# Patient Record
Sex: Female | Born: 1970 | Race: White | Hispanic: No | Marital: Married | Smoking: Never smoker
Health system: Southern US, Community
[De-identification: ages and names within clinical notes are randomized; demographics above are authoritative.]

## PROBLEM LIST (undated history)

## (undated) DIAGNOSIS — N61 Mastitis without abscess: Secondary | ICD-10-CM

## (undated) DIAGNOSIS — L732 Hidradenitis suppurativa: Secondary | ICD-10-CM

## (undated) DIAGNOSIS — F329 Major depressive disorder, single episode, unspecified: Secondary | ICD-10-CM

## (undated) DIAGNOSIS — F32A Depression, unspecified: Secondary | ICD-10-CM

## (undated) DIAGNOSIS — M542 Cervicalgia: Secondary | ICD-10-CM

## (undated) DIAGNOSIS — R200 Anesthesia of skin: Secondary | ICD-10-CM

## (undated) DIAGNOSIS — I82409 Acute embolism and thrombosis of unspecified deep veins of unspecified lower extremity: Secondary | ICD-10-CM

## (undated) HISTORY — PX: INGUINAL HIDRADENITIS EXCISION: SHX1827

## (undated) HISTORY — DX: Cervicalgia: M54.2

## (undated) HISTORY — PX: OTHER SURGICAL HISTORY: SHX169

## (undated) HISTORY — DX: Depression, unspecified: F32.A

## (undated) HISTORY — DX: Major depressive disorder, single episode, unspecified: F32.9

## (undated) HISTORY — DX: Anesthesia of skin: R20.0

## (undated) HISTORY — PX: AXILLARY HIDRADENITIS EXCISION: SUR522

## (undated) HISTORY — DX: Morbid (severe) obesity due to excess calories: E66.01

---

## 1999-07-10 ENCOUNTER — Emergency Department (HOSPITAL_COMMUNITY): Admission: EM | Admit: 1999-07-10 | Discharge: 1999-07-10 | Payer: Self-pay | Admitting: Emergency Medicine

## 1999-07-10 ENCOUNTER — Encounter: Payer: Self-pay | Admitting: Emergency Medicine

## 2000-01-21 ENCOUNTER — Encounter: Admission: RE | Admit: 2000-01-21 | Discharge: 2000-04-20 | Payer: Self-pay | Admitting: Neurology

## 2000-02-04 ENCOUNTER — Encounter: Admission: RE | Admit: 2000-02-04 | Discharge: 2000-05-04 | Payer: Self-pay | Admitting: Neurology

## 2000-03-16 ENCOUNTER — Ambulatory Visit (HOSPITAL_COMMUNITY): Admission: RE | Admit: 2000-03-16 | Discharge: 2000-03-16 | Payer: Self-pay | Admitting: Neurology

## 2000-06-20 ENCOUNTER — Encounter: Admission: RE | Admit: 2000-06-20 | Discharge: 2000-09-18 | Payer: Self-pay | Admitting: Neurology

## 2001-07-18 ENCOUNTER — Emergency Department (HOSPITAL_COMMUNITY): Admission: EM | Admit: 2001-07-18 | Discharge: 2001-07-18 | Payer: Self-pay | Admitting: *Deleted

## 2007-03-15 ENCOUNTER — Observation Stay (HOSPITAL_COMMUNITY): Admission: RE | Admit: 2007-03-15 | Discharge: 2007-03-16 | Payer: Self-pay | Admitting: General Surgery

## 2007-03-15 ENCOUNTER — Encounter (INDEPENDENT_AMBULATORY_CARE_PROVIDER_SITE_OTHER): Payer: Self-pay | Admitting: General Surgery

## 2007-03-18 ENCOUNTER — Emergency Department (HOSPITAL_COMMUNITY): Admission: EM | Admit: 2007-03-18 | Discharge: 2007-03-18 | Payer: Self-pay | Admitting: Emergency Medicine

## 2007-03-23 ENCOUNTER — Encounter (HOSPITAL_COMMUNITY): Admission: RE | Admit: 2007-03-23 | Discharge: 2007-04-22 | Payer: Self-pay | Admitting: General Surgery

## 2008-07-24 ENCOUNTER — Emergency Department (HOSPITAL_COMMUNITY): Admission: EM | Admit: 2008-07-24 | Discharge: 2008-07-24 | Payer: Self-pay | Admitting: Emergency Medicine

## 2009-07-29 ENCOUNTER — Emergency Department (HOSPITAL_COMMUNITY): Admission: EM | Admit: 2009-07-29 | Discharge: 2009-07-29 | Payer: Self-pay | Admitting: Emergency Medicine

## 2010-12-29 NOTE — Op Note (Signed)
NAMEHIEDI, TOUCHTON                ACCOUNT NO.:  [id number]   MEDICAL RECORD NO.:  [id number]          PATIENT TYPE:  OBV   LOCATION:  A306                          FACILITY:  [facility]   PHYSICIAN:  Barbaraann Barthel, M.D. DATE OF BIRTH:  05-15-1971   DATE OF PROCEDURE:  03/15/2007  DATE OF DISCHARGE:                               OPERATIVE REPORT   PREOPERATIVE DIAGNOSIS:  Hidradenitis suppurativa.   POSTOPERATIVE DIAGNOSIS:  Hidradenitis suppurativa.   PROCEDURE:  Excision of hidradenitis suppurativa from right axilla and  right and left groin areas.   NOTE:  This is an unfortunate 40 year old white female who has had  multiple episodes of procedures for her hidradenitis suppurativa.  She  had done her left axilla and she had had both her groin area and her  inframammary area and her right axilla done elsewhere at [facility],  and various other hospitals, and she returned to me as these had  recurred in the area of her groin and her right axilla.  We discussed  the procedure with her preoperatively putting close attention to the  fact that during one of these procedures she developed some neurapraxia  on her left side from being in the frog leg position.  We had this in  mind during the procedure.  We also discussed the need for extensive  dressing changes afterwards with soaks, etc., and perhaps a split  thickness skin graft at a later date.  As this patient has had multiple  procedures, she understood this quite well as did her mother and all  questions were answered to their satisfaction and informed consent was  obtained.   GROSS OPERATIVE FINDINGS:  The patient had obvious recurrent  hidradenitis suppurativa in her right axilla and both groin areas.   TECHNIQUE:  We kept the patient in the supine position without being in  any frog leg position and worked first on the groin. After prepping and  draping, we excised the area of hidradenitis suppurativa from the right  axilla  and then packed this open with saline soaked gauze and Kerlix  after hemostasis was obtained with the Bovie device.  While I held her  left thigh in a slightly frog leg position, we then prepped with  Betadine solution, excised this quickly using the cautery device and the  scalpel, and obtained hemostasis with the cautery device, returned her  to a straight leg position, and this was a very minimal amount of time  in this position without any tension of her body weight on this so that  I do not think we did any harm to her baseline status.  The wound,  likewise, was packed open with saline soaked gauze, ABDs, Kerlix, and  Kling.  An area, likewise, was done in the right groin, excising this  widely and  packing this open, as well.  Prior to closure, all sponge, needle, and  instrument counts were found to be correct.  Estimated blood loss was  minimal.  The patient received approximately a liter of crystalloids  intraoperatively and we will keep her in the hospital for  observation.  I will follow her for her dressing changes.      Barbaraann Barthel, M.D.  Electronically Signed     WB/MEDQ  D:  03/15/2007  T:  03/15/2007  Job:  [id number]   cc:   Mila Homer. Sudie Bailey, M.D.  Fax: [phone]

## 2011-01-01 NOTE — Procedures (Signed)
[facility]  Patient:    Cheryl Bentley                          MRN: [id number] Proc. Date: 03/16/00 Adm. Date:  [id number] Attending:  Erich Montane                           Procedure Report  CLINICAL INFORMATION:  This 40 year old white female has a history of left-sided neuropathy, headaches, and recently discovered papilledema.  She has been on a weight reduction diet for a suspected pseudotumor cerebri.  TECHNICAL DESCRIPTION:  The patient was prepped and draped in the left lateral decubitus position using Betadine and 1% Xylocaine.  She had a lot of fatty adipose tissue between her skin surface and the lumbar spine but the L4-L5 interspace was entered, initially pink to clear CSF was obtained.  The opening pressure was 360 mm of water and CSF was sent for studies.  The patient tolerated the procedure well.  No closing pressure was obtained. DD:  03/16/00 TD:  03/17/00 Job: [id number]

## 2011-05-21 LAB — PREGNANCY, URINE: Preg Test, Ur: NEGATIVE

## 2011-05-31 LAB — BASIC METABOLIC PANEL
BUN: 9
CO2: 28
Calcium: 8.7
Chloride: 105
Creatinine, Ser: 0.64
Glucose, Bld: 104 — ABNORMAL HIGH

## 2011-05-31 LAB — HCG, QUANTITATIVE, PREGNANCY: hCG, Beta Chain, Quant, S: 3

## 2011-05-31 LAB — CBC
MCHC: 33.6
MCV: 87.4
Platelets: 241
RDW: 14.7 — ABNORMAL HIGH

## 2011-05-31 LAB — DIFFERENTIAL
Basophils Absolute: 0
Basophils Relative: 0
Eosinophils Absolute: 0.1
Monocytes Relative: 5
Neutrophils Relative %: 68

## 2011-06-08 ENCOUNTER — Emergency Department (HOSPITAL_COMMUNITY)
Admission: EM | Admit: 2011-06-08 | Discharge: 2011-06-08 | Disposition: A | Discharge status: Home / Self Care | Payer: 59 | Attending: Emergency Medicine | Admitting: Emergency Medicine

## 2011-06-08 ENCOUNTER — Encounter: Payer: Self-pay | Admitting: *Deleted

## 2011-06-08 DIAGNOSIS — S61259A Open bite of unspecified finger without damage to nail, initial encounter: Secondary | ICD-10-CM

## 2011-06-08 DIAGNOSIS — S6000XA Contusion of unspecified finger without damage to nail, initial encounter: Secondary | ICD-10-CM | POA: Insufficient documentation

## 2011-06-08 DIAGNOSIS — IMO0002 Reserved for concepts with insufficient information to code with codable children: Secondary | ICD-10-CM | POA: Insufficient documentation

## 2011-06-08 DIAGNOSIS — W540XXA Bitten by dog, initial encounter: Secondary | ICD-10-CM | POA: Insufficient documentation

## 2011-06-08 HISTORY — DX: Hidradenitis suppurativa: L73.2

## 2011-06-08 MED ORDER — CEPHALEXIN 500 MG PO CAPS: 500.0000 mg | ORAL_CAPSULE | Freq: Four times a day (QID) | ORAL | Status: AC

## 2011-06-08 MED ORDER — BACITRACIN ZINC 500 UNIT/GM EX OINT
TOPICAL_OINTMENT | CUTANEOUS | Status: AC
  Administered 2011-06-08: 1
  Filled 2011-06-08: qty 0.9

## 2011-06-08 NOTE — ED Notes (Signed)
Rx given x1 D/c instructions reviewed w/ pt and family - pt and family deny any further questions or concerns at present.  

## 2011-06-08 NOTE — ED Notes (Signed)
Report made to [facility], deputy enroute

## 2011-06-08 NOTE — ED Notes (Signed)
Pt reports she and her brother were attempting to break up a dog fight - pt sustained injury to rt third digit, small puncture wound to ant aspect and injury to nail bed. Pt states she receives Humira injections and is concerned about the injuries d/t immunocompromised. No bleeding noted on assessment. 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

## 2011-06-08 NOTE — ED Provider Notes (Signed)
History     CSN: [id number] Arrival date & time: 06/08/2011  2:25 AM   First MD Initiated Contact with Patient 06/08/11 0243      Chief Complaint  Patient presents with  . Animal Bite    (Consider location/radiation/quality/duration/timing/severity/associated sxs/prior treatment) HPI Comments: Patient is a healthy 32 atrial female who presents approximately one hour after being bitten on her left middle finger by her dog. This dog is up-to-date on vaccinations and was upset because he was fighting with another dog over food. She states that the injury is constant, mild, worse with palpation and associated with a small crack in the nail with minimal amount of bleeding. He is up-to-date on her tetanus as of several months ago.  Patient is a 40 y.o. female presenting with animal bite. The history is provided by the patient.  Animal Bite  Associated symptoms include numbness.    History reviewed. No pertinent past medical history.  Past Surgical History  Procedure Date  . Axillary hidradenitis excision     No family history on file.  History  Substance Use Topics  . Smoking status: Never Smoker   . Smokeless tobacco: Not on file  . Alcohol Use: No    OB History    Grav Para Term Preterm Abortions TAB SAB Ect Mult Living                  Review of Systems  Skin: Positive for wound.  Neurological: Positive for numbness.  Hematological: Does not bruise/bleed easily.    Allergies  Amoxicillin and Penicillins  Home Medications   Current Outpatient Rx  Name Route Sig Dispense Refill  . HUMIRA [place] Subcutaneous Inject into the skin. Every other week       Pulse 126  Temp(Src) 98.3 F (36.8 C) (Oral)  Ht 6\' 1"  (1.854 m)  Wt 290 lb (131.543 kg)  BMI 38.26 kg/m2  SpO2 98%  LMP 06/01/2011  Physical Exam  Nursing note and vitals reviewed. Constitutional: She appears well-developed and well-nourished. No distress.  HENT:  Head: Normocephalic and atraumatic.    Eyes: Conjunctivae are normal. No scleral icterus.  Musculoskeletal: Normal range of motion. She exhibits tenderness. She exhibits no edema.       While tenderness to the distal left third digit, normal range of motion, normal flexor and extensor function of the finger.  Neurological: She is alert. Coordination normal.       Incision and motor normal to the finger on the left hand  Skin: Skin is warm and dry.       Abrasion to the volar pad of the distal third digit on the left, 1 cm crack in the mid to proximal nail of same finger, small subungual hematoma which is adequately draining.   Psychiatric: She has a normal mood and affect. Her behavior is normal.    ED Course  Procedures (including critical care time)  Labs Reviewed - No data to display No results found.   No diagnosis found.    MDM  Up-to-date on tetanus, patient is currently immunosuppressed on Humira will need antibiotics to prevent infection otherwise well appearing with no repairable injuries.        Vida Roller, MD 06/08/11 [phone]

## 2011-06-08 NOTE — ED Notes (Signed)
Bacitracin and adhesive bandage applied to wound.

## 2011-06-08 NOTE — ED Notes (Signed)
Pt reports she was attempting to separate her 2 labs that were fighting over food, pt c/o pain to left middle finger, pt reports both dogs are up to date with vaccinations

## 2013-08-01 ENCOUNTER — Emergency Department (INDEPENDENT_AMBULATORY_CARE_PROVIDER_SITE_OTHER)
Admission: EM | Admit: 2013-08-01 | Discharge: 2013-08-01 | Disposition: A | Discharge status: Home / Self Care | Payer: Self-pay | Source: Home / Self Care | Attending: Family Medicine | Admitting: Family Medicine

## 2013-08-01 ENCOUNTER — Encounter (HOSPITAL_COMMUNITY): Payer: Self-pay | Admitting: Emergency Medicine

## 2013-08-01 DIAGNOSIS — G5620 Lesion of ulnar nerve, unspecified upper limb: Secondary | ICD-10-CM

## 2013-08-01 DIAGNOSIS — G5622 Lesion of ulnar nerve, left upper limb: Secondary | ICD-10-CM

## 2013-08-01 NOTE — ED Notes (Signed)
Left arm numbness, onset 12/17

## 2013-08-01 NOTE — ED Provider Notes (Signed)
CSN: [id number]     Arrival date & time 08/01/13  1241 History   First MD Initiated Contact with Patient 08/01/13 1427     Chief Complaint  Patient presents with  . Numbness   (Consider location/radiation/quality/duration/timing/severity/associated sxs/prior Treatment) Patient is a 42 y.o. female presenting with neurologic complaint.  Neurologic Problem This is a new problem. The current episode started yesterday (tingling in left 4th and 5th fingers yest at work, pain radiating up arm todat, no neck pain, no apparent trauma to arm, works at [employer].). The problem has been gradually worsening. Pertinent negatives include no headaches.    Past Medical History  Diagnosis Date  . Hidradenitis suppurativa    Past Surgical History  Procedure Laterality Date  . Axillary hidradenitis excision     No family history on file. History  Substance Use Topics  . Smoking status: Never Smoker   . Smokeless tobacco: Not on file  . Alcohol Use: No   OB History   Grav Para Term Preterm Abortions TAB SAB Ect Mult Living                 Review of Systems  Constitutional: Negative.   Neurological: Positive for numbness. Negative for dizziness, tremors, weakness, light-headedness and headaches.    Allergies  Amoxicillin and Penicillins  Home Medications   Current Outpatient Rx  Name  Route  Sig  Dispense  Refill  . spironolactone (ALDACTONE) 50 MG tablet   Oral   Take 50 mg by mouth daily.         . ValACYclovir HCl (VALTREX PO)   Oral   Take by mouth.         . Adalimumab (HUMIRA [place])   Subcutaneous   Inject into the skin. Every other week           BP 142/83  Pulse 70  Temp(Src) 97.9 F (36.6 C) (Oral)  Resp 16  SpO2 100%  LMP 06/30/2013 Physical Exam  Nursing note and vitals reviewed. Constitutional: She is oriented to person, place, and time. She appears well-developed and well-nourished.  HENT:  Head: Normocephalic.  Neck: Normal range of motion. Neck supple.   Musculoskeletal: Normal range of motion.  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  Lymphadenopathy:    She has no cervical adenopathy.  Neurological: She is alert and oriented to person, place, and time. She displays normal reflexes. No cranial nerve deficit. Coordination normal.  Left ulnar n localizing arm sx, nl grip, median and radial n intact, tender at ulnar groove, shoulder intact.no neck, no right sided sx.  Skin: Skin is warm and dry.    ED Course  Procedures (including critical care time) Labs Review Labs Reviewed - No data to display Imaging Review No results found.  EKG Interpretation    Date/Time:    Ventricular Rate:    PR Interval:    QRS Duration:   QT Interval:    QTC Calculation:   R Axis:     Text Interpretation:              MDM      Linna Hoff, MD 08/01/13 1451

## 2013-10-31 ENCOUNTER — Other Ambulatory Visit: Payer: Self-pay | Admitting: Family Medicine

## 2013-10-31 DIAGNOSIS — R928 Other abnormal and inconclusive findings on diagnostic imaging of breast: Secondary | ICD-10-CM

## 2013-12-10 ENCOUNTER — Ambulatory Visit
Admission: RE | Admit: 2013-12-10 | Discharge: 2013-12-10 | Disposition: A | Discharge status: Home / Self Care | Payer: 59 | Source: Ambulatory Visit | Attending: Family Medicine | Admitting: Family Medicine

## 2013-12-10 DIAGNOSIS — R928 Other abnormal and inconclusive findings on diagnostic imaging of breast: Secondary | ICD-10-CM

## 2014-08-02 ENCOUNTER — Ambulatory Visit (HOSPITAL_COMMUNITY)
Admission: RE | Admit: 2014-08-02 | Discharge: 2014-08-02 | Disposition: A | Discharge status: Home / Self Care | Payer: 59 | Source: Ambulatory Visit | Attending: Family Medicine | Admitting: Family Medicine

## 2014-08-02 DIAGNOSIS — T8189XA Other complications of procedures, not elsewhere classified, initial encounter: Secondary | ICD-10-CM | POA: Diagnosis present

## 2014-08-02 NOTE — Therapy (Signed)
[facility] [address] [place], [place], [address] Phone: [phone]   Fax:  [phone]  Physical Therapy Evaluation  Patient Details  Name: Cheryl Bentley MRN: [id number] Date of Birth: 07/18/1971  Encounter Date: 08/02/2014      PT End of Session - 08/02/14 1459    Visit Number 1   Number of Visits 16   Date for PT Re-Evaluation 09/01/14   Authorization Type [employer],    Authorization Time Period 08/02/14-2/30/16   Authorization - Visit Number 1   Authorization - Number of Visits 16   PT Start Time 1345   PT Stop Time 1430   PT Time Calculation (min) 45 min   Activity Tolerance Patient tolerated treatment well   Behavior During Therapy [facility] for tasks assessed/performed      Past Medical History  Diagnosis Date  . Hidradenitis suppurativa     Past Surgical History  Procedure Laterality Date  . Axillary hidradenitis excision      There were no vitals taken for this visit.  Visit Diagnosis:  Non-healing surgical wound, initial encounter      Subjective Assessment - 08/02/14 1444    Symptoms Patient presents to physical therapy for wound care following  surgical excision of of skin inferior to Rt gluteal cleft with hidrenitis suppurativa. Patient states wound was originally 12 cm by 12cm. Patient has a long history of hidradenitis suppurativa for the last 19 years requiring multiple surgeries and treatments.    Limitations Sitting   How long can you sit comfortably? difficulty nd pain sitting due to pain.    Currently in Pain? Yes   Pain Score 2    Pain Location Hip   Pain Orientation Right;Posterior;Distal                   Wound Therapy - 08/02/14 1449    Wound Properties Date First Assessed: 08/02/14 Time First Assessed: 1347 Wound Type: Incision - Open Location: Thigh Location Orientation: Posterior;Proximal;Right Wound Description (Comments): 100% granualted 7.4x5.7 cm Present on Admission: Yes   Dressing Type Abdominal pads;Gauze (Comment)  and fishnet   Dressing Changed New   Dressing Status Clean   Dressing Change Frequency Every 3 days   Site / Wound Assessment Clean   % Wound base Red or Granulating 95%   % Wound base Yellow 5%   % Wound base Black 0%   % Wound base Other (Comment) 0%   Peri-wound Assessment Intact;Pink   Wound Length (cm) 7.3 cm   Wound Width (cm) 5.7 cm   Wound Depth (cm) 0 cm   Tunneling (cm) 0   Undermining (cm) 0   Margins Unattached edges (unapproximated)   Closure None   Drainage Amount Moderate   Drainage Description Sanguineous   Non-staged Wound Description Not applicable   Treatment Cleansed  banadage changed   Wound Therapy - Clinical Statement Patient's wound is nearly 95% granulated with minimal slough no escar. patient's wound displays appropriate amounts of drainage with good healingtakign place since surgical excision of skin. Patient was self dressing wound with gauze and silver. Patient was advised to utilize just gauze and saline as wund is healing well and that dressing changes shoudl be changed from 3x a week to 2x a week to allow for increased time for wound healing. Patient will beenfit from continued skilled phsyicl therapy for monitoring of wound and appropriately clean/debride if any changes occur.    Wound Therapy - Functional Problem List difficulty  sitting >210minutes due to pain.    Wound Therapy - Frequency --  2x weekly, reduce if wound continues to heal well.    Wound Plan Continue to monitor wound healing, apply appropriate dressings as needed to improve wound healing environment if needed.    Dressing  ABD pad and gauze fishnet.    Decrease Necrotic Tissue to 0%   Decrease Necrotic Tissue - Progress Goal set today   Increase Granulation Tissue to 100%   Increase Granulation Tissue - Progress Goal set today   Decrease Length/Width/Depth by (cm) 5.5x5.5x    Decrease Length/Width/Depth - Progress Goal set today   Improve  Drainage Characteristics Mod  Long term goal   Improve Drainage Characteristics - Progress Goal set today   Patient/Family will be able to  sit > 30minutes wthout pain.   Long term goal   Patient/Family Instruction Goal - Progress Goal set today  long term goal.    Time For Goal Achievement --  8 weeks.   Wound Therapy - Potential for Goals Good        Problem List There are no active problems to display for this patient.  Jerilee Fieldash Jodi Criscuolo PT DPT [phone]

## 2014-08-06 ENCOUNTER — Encounter (HOSPITAL_COMMUNITY): Payer: Self-pay

## 2014-08-06 ENCOUNTER — Ambulatory Visit (HOSPITAL_COMMUNITY)
Admission: RE | Admit: 2014-08-06 | Discharge: 2014-08-06 | Disposition: A | Discharge status: Home / Self Care | Payer: 59 | Source: Ambulatory Visit | Attending: Family Medicine | Admitting: Family Medicine

## 2014-08-06 DIAGNOSIS — T8189XA Other complications of procedures, not elsewhere classified, initial encounter: Secondary | ICD-10-CM

## 2014-08-06 NOTE — Addendum Note (Signed)
Encounter addended by: Fredricka Bonineasey J Jaylean Buenaventura, PTA on: 08/06/2014 10:11 AM<BR>     Documentation filed: Clinical Notes, Flowsheet VN

## 2014-08-06 NOTE — Therapy (Addendum)
[facility] [address] [place], [place], [address] Phone: [phone]   Fax:  [phone]  Wound Care Therapy  Patient Details  Name: [name] MRN: [id number] Date of Birth: 04/27/1971  Encounter Date: 08/06/2014      PT End of Session - 08/06/14 1010    Visit Number 2   Number of Visits 16   Date for PT Re-Evaluation 09/01/14   Authorization Type UHC,    Authorization Time Period 08/02/14-2/30/16   Authorization - Visit Number 2   Authorization - Number of Visits 16   PT Start Time [phone]   PT Stop Time 0940   PT Time Calculation (min) 17 min   Activity Tolerance Patient tolerated treatment well   Behavior During Therapy [facility] for tasks assessed/performed      Past Medical History  Diagnosis Date  . Hidradenitis suppurativa     Past Surgical History  Procedure Laterality Date  . Axillary hidradenitis excision      There were no vitals taken for this visit.  Visit Diagnosis:  Non-healing surgical wound, initial encounter      Subjective Assessment - 08/06/14 0947    Symptoms Pt stated comfidence with changing dressings at home, reported she applied extra layer for drainage over weekend.  Current pain scale 3/10   Currently in Pain? Yes   Pain Score 3    Pain Location Hip   Pain Orientation Right;Posterior;Medial            Wound Therapy - 08/06/14 0947    Wound Properties Date First Assessed: 08/02/14 Time First Assessed: 1347 Wound Type: Incision - Open Location: Thigh Location Orientation: Posterior;Proximal;Right Wound Description (Comments): 100% granualted 7.4x5.7 cm Present on Admission: Yes   Dressing Type Abdominal pads;Gauze (Comment)  with fishnet   Dressing Changed New   Dressing Status Clean;Dry;Intact   Dressing Change Frequency Every 3 days   Site / Wound Assessment Clean   % Wound base Red or Granulating 95%   % Wound base Yellow 5%   % Wound base Black 0%   % Wound base Other  (Comment) 0%   Peri-wound Assessment Intact;Pink   Margins Unattached edges (unapproximated)   Closure None   Drainage Amount Moderate   Drainage Description Sanguineous   Non-staged Wound Description Not applicable   Treatment Cleansed;Debridement (Selective)   Selective Debridement - Location Rt gluteal cleft   Selective Debridement - Tools Used Forceps   Selective Debridement - Tissue Removed slough   Wound Therapy - Clinical Statement Minimal debridement required with this session, wound 95% granulation tissue with no signs of infection noted.  Pt e4ucated on wound healing process and appropriate dressings.  No reports of increased pain through session   Wound Therapy - Functional Problem List difficulty sitting >3510minutes due to pain.    Wound Plan Continue to monitor wound healing, apply appropriate dressings as needed to improve wound healing environment if needed.    Dressing  ABD pad and gauze fishnet.    Decrease Necrotic Tissue to 0%   Decrease Necrotic Tissue - Progress Progressing toward goal   Increase Granulation Tissue to 100%   Increase Granulation Tissue - Progress Progressing toward goal   Decrease Length/Width/Depth by (cm) 5.5x5.5x    Decrease Length/Width/Depth - Progress Progressing toward goal   Improve Drainage Characteristics Mod   Improve Drainage Characteristics - Progress Progressing toward goal   Patient/Family will be able to  sit > 30minutes wthout pain.    Patient/Family  Instruction Goal - Progress Progressing toward goal            PT Education - 08/06/14 0955    Education provided Yes   Education Details Healing process and appropriate dressings with drainage   Person(s) Educated Patient   Methods Explanation   Comprehension Verbalized understanding;Returned demonstration      Problem List There are no active problems to display for this patient.  Becky Saxasey Cockerham, ArizonaLPTA [phone]  Juel BurrowCockerham, Casey Jo 08/06/2014, 10:10 AM  [facility] [address] , [place], [address] Phone: [phone]   Fax:  [phone]

## 2014-08-14 ENCOUNTER — Ambulatory Visit (HOSPITAL_COMMUNITY)
Admission: RE | Admit: 2014-08-14 | Discharge: 2014-08-14 | Disposition: A | Discharge status: Home / Self Care | Payer: 59 | Source: Ambulatory Visit | Attending: Family Medicine | Admitting: Family Medicine

## 2014-08-14 DIAGNOSIS — T8189XA Other complications of procedures, not elsewhere classified, initial encounter: Secondary | ICD-10-CM | POA: Diagnosis not present

## 2014-08-14 NOTE — Therapy (Signed)
[place], [place], [address] Phone: [phone]   Fax:  [phone]  Wound Care Therapy  Patient Details  Name: Cheryl Bentley MRN: [id number] Date of Birth: 1971-05-09  Encounter Date: 08/14/2014    Past Medical History  Diagnosis Date  . Hidradenitis suppurativa     Past Surgical History  Procedure Laterality Date  . Axillary hidradenitis excision      There were no vitals taken for this visit.  Visit Diagnosis:  Non-healing surgical wound, initial encounter         Wound Therapy - 08/14/14 1009    Subjective Pt is completing dressings at home states it is starting to stick when she goes to take the dressing off    Patient and Family Stated Goals For wound to heal and be able to sit withtou pain   Date of Onset 06/10/14   Prior Treatments Home health therapy   Pain Score 2    Pain Location --  inner thigh   Wound Properties Date First Assessed: 08/02/14 Time First Assessed: 1347 Wound Type: Incision - Open Location: Thigh Location Orientation: Posterior;Proximal;Right Wound Description (Comments): 100% granualted 7.4x5.7 cm Present on Admission: Yes   Dressing Type Impregnated gauze (bismuth)   Dressing Changed New   Dressing Status Clean;Old drainage   Dressing Change Frequency Daily  self dressing    Site / Wound Assessment Clean;Red   % Wound base Red or Granulating --  99   % Wound base Yellow --  1   Peri-wound Assessment Intact;Pink;Other (Comment)  harden   Wound Length (cm) 3.7 cm   Wound Width (cm) 5.5 cm   Drainage Amount Minimal   Drainage Description Serous   Treatment Cleansed   Wound Therapy - Clinical Statement Wound is almost 100% granulated.  Pt will be decreased to one time a week.  Area that is healing is hard to touch instructed pt in self massage to decrease scar tissue forming under healing tissue.    Wound Therapy - Functional Problem List difficulty sitting  >48mnutes due to pain.    Wound Plan Decreased to one time a week; self care instruction given for self massage    Dressing  xeroform/ abpad   Decrease Necrotic Tissue to 0%   Decrease Necrotic Tissue - Progress Progressing toward goal   Increase Granulation Tissue to --  100%   Increase Granulation Tissue - Progress Progressing toward goal   Decrease Length/Width/Depth by (cm) 5.5x5.5x    Decrease Length/Width/Depth - Progress Progressing toward goal   Improve Drainage Characteristics Mod   Improve Drainage Characteristics - Progress Partly met   Patient/Family will be able to  sit > 33mutes wthout pain.    Patient/Family Instruction Goal - Progress Progressing toward goal        Problem List There are no active problems to display for this patient.   Zalma Channing,CINDY PT 08/14/2014, 10:21 AM  [place]: [phone] Fax:  [phone]

## 2014-08-15 ENCOUNTER — Encounter (HOSPITAL_COMMUNITY): Payer: Self-pay | Admitting: Physical Therapy

## 2014-08-15 DIAGNOSIS — T8189XA Other complications of procedures, not elsewhere classified, initial encounter: Secondary | ICD-10-CM

## 2014-08-19 ENCOUNTER — Ambulatory Visit (HOSPITAL_COMMUNITY)
Admission: RE | Admit: 2014-08-19 | Discharge: 2014-08-19 | Disposition: A | Discharge status: Home / Self Care | Payer: 59 | Source: Ambulatory Visit | Attending: Family Medicine | Admitting: Family Medicine

## 2014-08-19 DIAGNOSIS — Y839 Surgical procedure, unspecified as the cause of abnormal reaction of the patient, or of later complication, without mention of misadventure at the time of the procedure: Secondary | ICD-10-CM | POA: Insufficient documentation

## 2014-08-19 DIAGNOSIS — T8189XA Other complications of procedures, not elsewhere classified, initial encounter: Secondary | ICD-10-CM | POA: Diagnosis not present

## 2014-08-19 NOTE — Therapy (Signed)
[place], [place], [address] Phone: [phone]   Fax:  [phone]  Wound Care Therapy  Patient Details  Name: Cheryl Bentley MRN: [id number] Date of Birth: March 09, 1971  Encounter Date: 08/19/2014      PT End of Session - 08/19/14 0829    Visit Number 3   Number of Visits 16   Date for PT Re-Evaluation 09/01/14   Authorization Type UHC,    Authorization Time Period 08/02/14-2/30/16   Authorization - Visit Number 3   Authorization - Number of Visits 16   PT Start Time 0800   PT Stop Time 0820   PT Time Calculation (min) 20 min   Activity Tolerance Patient tolerated treatment well   Behavior During Therapy [facility] for tasks assessed/performed      Past Medical History  Diagnosis Date  . Hidradenitis suppurativa     Past Surgical History  Procedure Laterality Date  . Axillary hidradenitis excision      There were no vitals taken for this visit.  Visit Diagnosis:  Non-healing surgical wound, initial encounter         Wound Therapy - 08/19/14 0820    Subjective Pt with continued compliance with dressings changes.  Pt with dressings intact     Patient and Family Stated Goals For wound to heal and be able to sit withtou pain   Date of Onset 06/10/14   Prior Treatments Home health therapy   Pain Score 2    Pain Location --  Rt inner thigh area   Pain Orientation Right;Medial   Wound Properties Date First Assessed: 08/02/14 Time First Assessed: 1347 Wound Type: Incision - Open Location: Thigh Location Orientation: Posterior;Proximal;Right Wound Description (Comments): 100% granualted 7.4x5.7 cm Present on Admission: Yes   Dressing Type Hydrogel;Abdominal pads   Dressing Changed New   Dressing Status Clean;Old drainage   Dressing Change Frequency Daily  self dressing    Site / Wound Assessment Clean;Red   % Wound base Red or Granulating 95%  99   % Wound base Yellow 5%  1   % Wound base Black 0%    % Wound base Other (Comment) 0%   Peri-wound Assessment Intact;Pink;Other (Comment)  harden   Drainage Amount Minimal   Drainage Description Serous   Treatment Cleansed;Debridement (Selective)   Selective Debridement - Location Rt gluteal cleft   Selective Debridement - Tools Used Forceps   Selective Debridement - Tissue Removed slough   Wound Therapy - Clinical Statement Wound with 100% granulation.  Debrided borders of wound and massaged to decrease indurated skin.  Pt reported some tenderness with this.  Changed dressing to hydrogel per patient (evaluating PT left her a voice mail).  continued with #9 netting to secure dressing.   Wound Therapy - Functional Problem List difficulty sitting >40mnutes due to pain.    Wound Plan Decreased to one time a week; self care instruction given for self massage    Dressing  hydrogel gauze, ABD pad, #9 netting   Decrease Necrotic Tissue to 0%   Decrease Necrotic Tissue - Progress Met   Increase Granulation Tissue to 100%  100%   Increase Granulation Tissue - Progress Met   Decrease Length/Width/Depth by (cm) 5.5x5.5x    Decrease Length/Width/Depth - Progress Progressing toward goal   Improve Drainage Characteristics Mod   Improve Drainage Characteristics - Progress Met   Patient/Family will be able to  sit > 372mutes wthout pain.    Patient/Family Instruction  Goal - Progress Progressing toward goal        Teena Irani, PTA/CLT [phone] 08/19/2014, 8:31 AM  [place], [place], [address] Phone: [phone]   Fax:  [phone]

## 2014-08-22 ENCOUNTER — Ambulatory Visit (HOSPITAL_COMMUNITY): Payer: 59 | Admitting: Physical Therapy

## 2014-08-27 ENCOUNTER — Ambulatory Visit (HOSPITAL_COMMUNITY)
Admission: RE | Admit: 2014-08-27 | Discharge: 2014-08-27 | Disposition: A | Discharge status: Home / Self Care | Payer: 59 | Source: Ambulatory Visit | Attending: Family Medicine | Admitting: Family Medicine

## 2014-08-27 DIAGNOSIS — T8189XA Other complications of procedures, not elsewhere classified, initial encounter: Secondary | ICD-10-CM

## 2014-08-27 NOTE — Therapy (Signed)
[facility] [address] [place], [place], [address] Phone: [phone]   Fax:  [phone]  Wound Care Therapy  Patient Details  Name: Cheryl Bentley MRN: [id number] Date of Birth: 04/16/1971 Referring Provider:  Knowlton, Stephen D, MD  Encounter Date: 08/27/2014      PT End of Session - 08/27/14 0831    Visit Number 4   Number of Visits 16   Date for PT Re-Evaluation 09/26/14   Authorization Type UHC,    Authorization Time Period 08/02/14-2/30/16   Authorization - Visit Number 4   Authorization - Number of Visits 16   PT Start Time 0801   PT Stop Time 0825   PT Time Calculation (min) 24 min   Activity Tolerance Patient tolerated treatment well   Behavior During Therapy [facility] for tasks assessed/performed      Past Medical History  Diagnosis Date  . Hidradenitis suppurativa     Past Surgical History  Procedure Laterality Date  . Axillary hidradenitis excision      There were no vitals taken for this visit.  Visit Diagnosis:  Non-healing surgical wound, initial encounter      Subjective Assessment - 08/27/14 0826    Symptoms Patient is happy with progress and excited for wound healing progress going forward. notes no pain.   Currently in Pain? No/denies            Wound Therapy - 08/27/14 0828    Wound Properties Date First Assessed: 08/02/14 Time First Assessed: 1347 Wound Type: Incision - Open Location: Thigh Location Orientation: Posterior;Proximal;Right Wound Description (Comments): 100% granualted 7.4x5.7 cm Present on Admission: Yes   Selective Debridement - Location Rt gluteal cleft   Selective Debridement - Tools Used Forceps;Scissors   Selective Debridement - Tissue Removed slough and dead skin   Wound Therapy - Clinical Statement Wound with near 100% granulation at end of session.  Debrided borders of wound.  Pt reported some tenderness with this.  Changed dressing to hydrogel per patient (evaluating PT  left her a voice mail).  continued with #9 netting to secure dressing.   Wound Plan Continue to one time a week; self care instruction previously given for self massage    Dressing  hydrogel gauze, ABD pad, #9 netting   Decrease Necrotic Tissue to 0%   Decrease Necrotic Tissue - Progress Met   Increase Granulation Tissue to 100%  100%   Increase Granulation Tissue - Progress Met   Decrease Length/Width/Depth by (cm) 5.5x5.5x    Decrease Length/Width/Depth - Progress Progressing toward goal   Improve Drainage Characteristics Mod   Improve Drainage Characteristics - Progress Met   Patient/Family will be able to  sit > 30minutes wthout pain.    Patient/Family Instruction Goal - Progress Progressing toward goal          Problem List There are no active problems to display for this patient.   Cash DeWitt PT DPT [phone]  [facility] [address] [place], [place], [address] Phone: [phone]   Fax:  [phone]       

## 2014-08-30 ENCOUNTER — Ambulatory Visit (HOSPITAL_COMMUNITY): Payer: 59

## 2014-09-03 ENCOUNTER — Ambulatory Visit (HOSPITAL_COMMUNITY): Payer: 59

## 2014-09-06 ENCOUNTER — Ambulatory Visit (HOSPITAL_COMMUNITY): Payer: 59 | Admitting: Physical Therapy

## 2014-09-09 ENCOUNTER — Ambulatory Visit (HOSPITAL_COMMUNITY): Payer: 59 | Admitting: Physical Therapy

## 2014-09-12 ENCOUNTER — Ambulatory Visit (HOSPITAL_COMMUNITY)
Admission: RE | Admit: 2014-09-12 | Discharge: 2014-09-12 | Disposition: A | Discharge status: Home / Self Care | Payer: 59 | Source: Ambulatory Visit | Attending: Family Medicine | Admitting: Family Medicine

## 2014-09-12 DIAGNOSIS — T8189XA Other complications of procedures, not elsewhere classified, initial encounter: Secondary | ICD-10-CM | POA: Diagnosis not present

## 2014-09-12 NOTE — Therapy (Addendum)
[place], [place], [address] Phone: [phone]   Fax:  [phone]  Wound Care Therapy  Patient Details  Name: Cheryl Bentley MRN: [id number] Date of Birth: 08/19/1970 Referring Provider:  Robert Bellow, MD  Encounter Date: 09/12/2014      PT End of Session - 09/12/14 0941    Visit Number 5   Number of Visits 16   Date for PT Re-Evaluation 09/26/14   Authorization Type UHC,    Authorization Time Period 08/02/14-2/30/16   Authorization - Visit Number 5   Authorization - Number of Visits 16   PT Start Time 0850   PT Stop Time 0915   PT Time Calculation (min) 25 min   Activity Tolerance Patient tolerated treatment well   Behavior During Therapy [facility] for tasks assessed/performed      Past Medical History  Diagnosis Date  . Hidradenitis suppurativa     Past Surgical History  Procedure Laterality Date  . Axillary hidradenitis excision      There were no vitals taken for this visit.  Visit Diagnosis:  Non-healing surgical wound, initial encounter         Wound Therapy - 09/12/14 0929    Subjective Dressings intact.  No complaints of pain.   Pain Score 0-No pain   Wound Properties Date First Assessed: 08/02/14 Time First Assessed: 1347 Wound Type: Incision - Open Location: Thigh Location Orientation: Posterior;Proximal;Right Wound Description (Comments): 100% granualted 7.4x5.7 cm Present on Admission: Yes   Dressing Type Impregnated gauze (bismuth)   Dressing Changed New   Dressing Status Clean;Old drainage   Dressing Change Frequency Daily  self dressing    Site / Wound Assessment Clean;Red;Other (Comment)   % Wound base Red or Granulating 95%   % Wound base Yellow 5%   % Wound base Black 0%   % Wound base Other (Comment) 0%   Peri-wound Assessment Intact;Other (Comment)  dryness   Wound Length (cm) 1.2 cm  was 2.8   Wound Width (cm) 2.8 cm  was 4.2   Wound Depth (cm) 0 cm   Margins Attached edges (approximated)   Drainage Amount Minimal   Drainage Description Serous   Treatment Cleansed;Debridement (Selective)   Selective Debridement - Location Rt inner, posterior thigh   Selective Debridement - Tools Used Forceps   Selective Debridement - Tissue Removed slough and dead skin   Wound Therapy - Clinical Statement wound with alot of dryness, hardened skin perimeter.  Able to debride away most of this.  sloughy film covering wound, able to remove 1/2 of this but began bleeding.  Changed dressing to xeroform as perimeter is very dry.  PT given extra xeroform to use at home as well as extra #9 netting.     Wound Plan Continue to one time a week; self care instruction previously given for self massage    Dressing  xeroform, ABD, #9 netting   Decrease Necrotic Tissue to 0%   Decrease Necrotic Tissue - Progress Progressing toward goal   Increase Granulation Tissue to 100%   Increase Granulation Tissue - Progress Progressing toward goal   Decrease Length/Width/Depth by (cm) 5.5x5.5x    Decrease Length/Width/Depth - Progress Progressing toward goal   Improve Drainage Characteristics Mod   Improve Drainage Characteristics - Progress Met   Patient/Family will be able to  sit > 8mnutes wthout pain.    Patient/Family Instruction Goal - Progress Progressing toward goal  Problem List There are no active problems to display for this patient.   Teena Irani, PTA/CLT [phone] 09/12/2014, 10:05 AM  [place], [place], [address] Phone: [phone]   Fax:  [phone]    PHYSICAL THERAPY DISCHARGE SUMMARY  Visits from Start of Care: 5  Current functional level related to goals / functional outcomes: unknown   Remaining deficits: unknown   Education / Equipment: Care for wound  Plan: Patient agrees to discharge.  Patient goals were not met. Patient is being discharged due to  not returning since the last visit.  ?????       Rayetta Humphrey, [place] CLT [phone]

## 2014-09-13 ENCOUNTER — Ambulatory Visit (HOSPITAL_COMMUNITY): Payer: 59 | Admitting: Physical Therapy

## 2014-09-18 ENCOUNTER — Ambulatory Visit (HOSPITAL_COMMUNITY): Payer: 59

## 2014-09-19 ENCOUNTER — Ambulatory Visit (HOSPITAL_COMMUNITY): Admission: RE | Admit: 2014-09-19 | Payer: 59 | Source: Ambulatory Visit | Admitting: Physical Therapy

## 2014-09-25 ENCOUNTER — Ambulatory Visit (HOSPITAL_COMMUNITY): Payer: 59 | Admitting: Physical Therapy

## 2014-09-26 ENCOUNTER — Ambulatory Visit (HOSPITAL_COMMUNITY): Payer: 59 | Attending: Family Medicine

## 2014-10-02 ENCOUNTER — Ambulatory Visit (HOSPITAL_COMMUNITY): Payer: 59 | Admitting: Physical Therapy

## 2014-10-03 ENCOUNTER — Ambulatory Visit (HOSPITAL_COMMUNITY): Payer: 59 | Admitting: Physical Therapy

## 2014-10-09 ENCOUNTER — Ambulatory Visit (HOSPITAL_COMMUNITY): Payer: 59

## 2014-12-05 ENCOUNTER — Emergency Department (HOSPITAL_COMMUNITY)
Admission: EM | Admit: 2014-12-05 | Discharge: 2014-12-05 | Disposition: A | Discharge status: Home / Self Care | Payer: BC Managed Care – PPO | Attending: Emergency Medicine | Admitting: Emergency Medicine

## 2014-12-05 ENCOUNTER — Encounter (HOSPITAL_COMMUNITY): Payer: Self-pay | Admitting: Emergency Medicine

## 2014-12-05 ENCOUNTER — Emergency Department (INDEPENDENT_AMBULATORY_CARE_PROVIDER_SITE_OTHER)
Admission: EM | Admit: 2014-12-05 | Discharge: 2014-12-05 | Disposition: A | Discharge status: Hospice | Payer: BC Managed Care – PPO | Source: Home / Self Care | Attending: Family Medicine | Admitting: Family Medicine

## 2014-12-05 ENCOUNTER — Encounter (HOSPITAL_COMMUNITY): Payer: Self-pay | Admitting: Cardiology

## 2014-12-05 DIAGNOSIS — Z880 Allergy status to penicillin: Secondary | ICD-10-CM | POA: Insufficient documentation

## 2014-12-05 DIAGNOSIS — R609 Edema, unspecified: Secondary | ICD-10-CM | POA: Diagnosis not present

## 2014-12-05 DIAGNOSIS — Z792 Long term (current) use of antibiotics: Secondary | ICD-10-CM | POA: Diagnosis not present

## 2014-12-05 DIAGNOSIS — M79661 Pain in right lower leg: Secondary | ICD-10-CM | POA: Diagnosis present

## 2014-12-05 DIAGNOSIS — Z79899 Other long term (current) drug therapy: Secondary | ICD-10-CM | POA: Diagnosis not present

## 2014-12-05 DIAGNOSIS — Z86718 Personal history of other venous thrombosis and embolism: Secondary | ICD-10-CM | POA: Diagnosis not present

## 2014-12-05 DIAGNOSIS — R600 Localized edema: Secondary | ICD-10-CM | POA: Insufficient documentation

## 2014-12-05 DIAGNOSIS — M79604 Pain in right leg: Secondary | ICD-10-CM

## 2014-12-05 DIAGNOSIS — Z872 Personal history of diseases of the skin and subcutaneous tissue: Secondary | ICD-10-CM | POA: Diagnosis not present

## 2014-12-05 HISTORY — DX: Acute embolism and thrombosis of unspecified deep veins of unspecified lower extremity: I82.409

## 2014-12-05 MED ORDER — ENOXAPARIN SODIUM 150 MG/ML ~~LOC~~ SOLN
1.0000 mg/kg | Freq: Once | SUBCUTANEOUS | Status: AC
  Administered 2014-12-05: 150 mg via SUBCUTANEOUS
  Filled 2014-12-05: qty 1

## 2014-12-05 NOTE — ED Notes (Signed)
Pt reports right leg pain that started on Friday. [facility] for evaluation of possible DVT.

## 2014-12-05 NOTE — ED Notes (Signed)
C/o right leg pain and swelling States pain starts at thigh and radiates downward to ankle States hard to stand for long time Denies any injury to leg States leg has a burning feeling Ice, elevation and ibuprofen was used as tx

## 2014-12-05 NOTE — ED Provider Notes (Addendum)
CSN: [id number]     Arrival date & time 12/05/14  1848 History   First MD Initiated Contact with Patient 12/05/14 2000     Chief Complaint  Patient presents with  . Leg Pain     (Consider location/radiation/quality/duration/timing/severity/associated sxs/prior Treatment) HPI  Cheryl Bentley  presents to the emergency department for evaluation of right lower leg pain, swelling, and edema. She has a past medical history of hidradenitis and 14 DVT. 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 Patient recently had a medication switch Sho noticed increased peripheral edema espceially worse in right leg since Saturday with pain, tight swelling in the calf and extension into the popliteal fossa. Patient was seen at the urgent care earlier today and sent for DVT evaluation. She has a previous history of DVT of the left upper extremity. She denies smoking, recent confinement or recent surgeries. Past Medical History  Diagnosis Date  . Hidradenitis suppurativa   . DVT (deep venous thrombosis)    Past Surgical History  Procedure Laterality Date  . Axillary hidradenitis excision     History reviewed. No pertinent family history. History  Substance Use Topics  . Smoking status: Never Smoker   . Smokeless tobacco: Not on file  . Alcohol Use: No   OB History    No data available     Review of Systems  Ten systems reviewed and are negative for acute change, except as noted in the HPI.    Allergies  Amoxicillin and Penicillins  Home Medications   Prior to Admission medications   Medication Sig Start Date End Date Taking? Authorizing Provider  Adalimumab (HUMIRA ) Inject into the skin. Every other week    Yes Historical Provider, MD  doxycycline (DORYX) 100 MG DR capsule Take 100 mg by mouth 2 (two) times daily.   Yes Historical Provider, MD  folic acid (FOLVITE) 1 MG tablet Take 1 mg by mouth daily.   Yes Historical Provider, MD   ibuprofen (ADVIL,MOTRIN) 200 MG tablet Take 400 mg by mouth every 6 (six) hours as needed for mild pain.   Yes Historical Provider, MD  methotrexate 2.5 MG tablet Take 10 mg by mouth once a week. Wednesday   Yes Historical Provider, MD  norethindrone-ethinyl estradiol (CYCLAFEM,ALYACEN) 0.5/0.75/1-35 MG-MCG tablet Take 1 tablet by mouth daily.   Yes Historical Provider, MD  spironolactone (ALDACTONE) 50 MG tablet Take 150 mg by mouth daily.    Yes Historical Provider, MD   BP 125/68 mmHg  Pulse 72  Temp(Src) 98.2 F (36.8 C) (Oral)  Resp 16  Wt 329 lb 9 oz (149.489 kg)  SpO2 100%  LMP 11/11/2014 (Exact Date) Physical Exam  Constitutional: She is oriented to person, place, and time. She appears well-developed and well-nourished. No distress.  HENT:  Head: Normocephalic and atraumatic.  Eyes: Conjunctivae are normal. No scleral icterus.  Neck: Normal range of motion.  Cardiovascular: Normal rate, regular rhythm, normal heart sounds and intact distal pulses.  Exam reveals no gallop and no friction rub.   No murmur heard. Pulmonary/Chest: Effort normal and breath sounds normal. No respiratory distress.  Abdominal: Soft. Bowel sounds are normal. She exhibits no distension and no mass. There is no tenderness. There is no guarding.  Musculoskeletal:  Bilateral peripheral edema, right greater than left, with 2+ pitting, warmth, redness, pain and tightness of the right calf. Positive Homans sign  Neurological: She is alert and oriented to person, place, and  time.  Skin: Skin is warm and dry. She is not diaphoretic.  Nursing note and vitals reviewed.   ED Course  Procedures (including critical care time) Labs Review Labs Reviewed - No data to display  Imaging Review No results found.   EKG Interpretation None      MDM   Final diagnoses:  Pain of right lower extremity  Peripheral edema   BP 125/68 mmHg  Pulse 72  Temp(Src) 98.2 F (36.8 C) (Oral)  Resp 16  Wt 329 lb 9 oz  (149.489 kg)  SpO2 100%  LMP 11/11/2014 (Exact Date)  Patient with peripheral edema, swelling of the right lower extremity worse than the left. Tight. Concern for DVT with new estrogen containing oral contraceptives and previous history of DVT of the left upper extremity. The patient will be given Lovenox injection 1 mg/kg. She will follow up tomorrow morning for DVT evaluation. No chest pain or shortness of breath. No hemoptysis. She appears safe for discharge at this time    Arthor Captainbigail Belanna Manring, Cordelia Poche-C 12/05/14 2133  Linwood DibblesJon Knapp, MD 12/05/14 2332  Arthor CaptainAbigail Sadey Yandell, PA-C 12/07/14 1817  Linwood DibblesJon Knapp, MD 12/08/14 [phone]

## 2014-12-05 NOTE — ED Notes (Signed)
Pt reports chronic L side foot drop  RLE painful to palp warm and red

## 2014-12-05 NOTE — Discharge Instructions (Signed)

## 2014-12-05 NOTE — ED Provider Notes (Signed)
CSN: [id number]     Arrival date & time 12/05/14  1714 History   First MD Initiated Contact with Patient 12/05/14 1756     Chief Complaint  Patient presents with  . Leg Pain   (Consider location/radiation/quality/duration/timing/severity/associated sxs/prior Treatment) Patient is a 44 y.o. female presenting with leg pain. The history is provided by the patient.  Leg Pain Location:  Leg Leg location:  R lower leg Pain details:    Quality:  Aching and pressure   Radiates to:  Does not radiate   Severity:  Moderate   Duration:  6 days   Progression:  Unchanged Chronicity:  New Dislocation: no   Prior injury to area:  No Associated symptoms: swelling   Associated symptoms: no decreased ROM and no fever   Risk factors: obesity     Past Medical History  Diagnosis Date  . Hidradenitis suppurativa    Past Surgical History  Procedure Laterality Date  . Axillary hidradenitis excision     History reviewed. No pertinent family history. History  Substance Use Topics  . Smoking status: Never Smoker   . Smokeless tobacco: Not on file  . Alcohol Use: No   OB History    No data available     Review of Systems  Constitutional: Negative.  Negative for fever.  Cardiovascular: Positive for leg swelling. Negative for chest pain.    Allergies  Amoxicillin and Penicillins  Home Medications   Prior to Admission medications   Medication Sig Start Date End Date Taking? Authorizing Provider  Adalimumab (HUMIRA [place]) Inject into the skin. Every other week    Yes Historical Provider, MD  doxycycline (DORYX) 100 MG DR capsule Take 100 mg by mouth 2 (two) times daily.   Yes Historical Provider, MD  folic acid (FOLVITE) 1 MG tablet Take 1 mg by mouth daily.   Yes Historical Provider, MD  methotrexate 2.5 MG tablet Take 2.5 mg by mouth 4 (four) times a week.   Yes Historical Provider, MD  norethindrone-ethinyl estradiol (CYCLAFEM,ALYACEN) 0.5/0.75/1-35 MG-MCG tablet Take 1 tablet by mouth  daily.   Yes Historical Provider, MD  spironolactone (ALDACTONE) 50 MG tablet Take 50 mg by mouth daily.   Yes Historical Provider, MD  ValACYclovir HCl (VALTREX PO) Take by mouth.   Yes Historical Provider, MD   BP 129/74 mmHg  Pulse 81  Temp(Src) 98.9 F (37.2 C) (Oral)  Resp 12  SpO2 98%  LMP 11/11/2014 (Exact Date) Physical Exam  Constitutional: She is oriented to person, place, and time. She appears well-developed and well-nourished.  Musculoskeletal: She exhibits edema and tenderness.  Tender right calf sts,  Distal nvt intact.  Neurological: She is alert and oriented to person, place, and time.  Skin: Skin is warm and dry. There is erythema.  Nursing note and vitals reviewed.   ED Course  Procedures (including critical care time) Labs Review Labs Reviewed - No data to display  Imaging Review No results found.   MDM  No diagnosis found. Sent for eval for dvt of right lower leg from obesity and sitting on job.    Linna HoffJames D Dequita Schleicher, MD 12/05/14 [phone]

## 2014-12-06 ENCOUNTER — Emergency Department (HOSPITAL_COMMUNITY): Payer: BC Managed Care – PPO

## 2014-12-06 ENCOUNTER — Ambulatory Visit (HOSPITAL_COMMUNITY)
Admission: RE | Admit: 2014-12-06 | Discharge: 2014-12-06 | Disposition: A | Discharge status: Home / Self Care | Payer: BC Managed Care – PPO | Source: Ambulatory Visit | Attending: Emergency Medicine | Admitting: Emergency Medicine

## 2014-12-06 ENCOUNTER — Encounter (HOSPITAL_COMMUNITY): Payer: Self-pay | Admitting: Physical Medicine and Rehabilitation

## 2014-12-06 ENCOUNTER — Emergency Department (HOSPITAL_COMMUNITY)
Admission: EM | Admit: 2014-12-06 | Discharge: 2014-12-06 | Disposition: A | Discharge status: Home / Self Care | Payer: BC Managed Care – PPO | Attending: Emergency Medicine | Admitting: Emergency Medicine

## 2014-12-06 DIAGNOSIS — Z792 Long term (current) use of antibiotics: Secondary | ICD-10-CM | POA: Insufficient documentation

## 2014-12-06 DIAGNOSIS — Z79899 Other long term (current) drug therapy: Secondary | ICD-10-CM | POA: Diagnosis not present

## 2014-12-06 DIAGNOSIS — I82431 Acute embolism and thrombosis of right popliteal vein: Secondary | ICD-10-CM

## 2014-12-06 DIAGNOSIS — I82442 Acute embolism and thrombosis of left tibial vein: Secondary | ICD-10-CM | POA: Insufficient documentation

## 2014-12-06 DIAGNOSIS — I82491 Acute embolism and thrombosis of other specified deep vein of right lower extremity: Secondary | ICD-10-CM

## 2014-12-06 DIAGNOSIS — I82403 Acute embolism and thrombosis of unspecified deep veins of lower extremity, bilateral: Secondary | ICD-10-CM

## 2014-12-06 DIAGNOSIS — I82492 Acute embolism and thrombosis of other specified deep vein of left lower extremity: Secondary | ICD-10-CM | POA: Insufficient documentation

## 2014-12-06 DIAGNOSIS — I2699 Other pulmonary embolism without acute cor pulmonale: Secondary | ICD-10-CM | POA: Insufficient documentation

## 2014-12-06 DIAGNOSIS — M79605 Pain in left leg: Secondary | ICD-10-CM | POA: Diagnosis present

## 2014-12-06 DIAGNOSIS — Z880 Allergy status to penicillin: Secondary | ICD-10-CM | POA: Diagnosis not present

## 2014-12-06 DIAGNOSIS — I82411 Acute embolism and thrombosis of right femoral vein: Secondary | ICD-10-CM

## 2014-12-06 LAB — PROTIME-INR
INR: 0.99 (ref 0.00–1.49)
Prothrombin Time: 13.2 seconds (ref 11.6–15.2)

## 2014-12-06 LAB — BASIC METABOLIC PANEL
ANION GAP: 11 (ref 5–15)
BUN: 13 mg/dL (ref 6–23)
CALCIUM: 8.9 mg/dL (ref 8.4–10.5)
CO2: 22 mmol/L (ref 19–32)
Chloride: 104 mmol/L (ref 96–112)
Creatinine, Ser: 0.76 mg/dL (ref 0.50–1.10)
Glucose, Bld: 116 mg/dL — ABNORMAL HIGH (ref 70–99)
Potassium: 4 mmol/L (ref 3.5–5.1)
Sodium: 137 mmol/L (ref 135–145)

## 2014-12-06 LAB — CBC WITH DIFFERENTIAL/PLATELET
BASOS PCT: 0 % (ref 0–1)
Basophils Absolute: 0 10*3/uL (ref 0.0–0.1)
EOS PCT: 1 % (ref 0–5)
Eosinophils Absolute: 0.1 10*3/uL (ref 0.0–0.7)
HCT: 38.5 % (ref 36.0–46.0)
Hemoglobin: 12.7 g/dL (ref 12.0–15.0)
Lymphocytes Relative: 23 % (ref 12–46)
Lymphs Abs: 1.3 10*3/uL (ref 0.7–4.0)
MCH: 30.9 pg (ref 26.0–34.0)
MCHC: 33 g/dL (ref 30.0–36.0)
MCV: 93.7 fL (ref 78.0–100.0)
Monocytes Absolute: 0.4 10*3/uL (ref 0.1–1.0)
Monocytes Relative: 8 % (ref 3–12)
NEUTROS PCT: 68 % (ref 43–77)
Neutro Abs: 3.8 10*3/uL (ref 1.7–7.7)
Platelets: 183 10*3/uL (ref 150–400)
RBC: 4.11 MIL/uL (ref 3.87–5.11)
RDW: 15.8 % — ABNORMAL HIGH (ref 11.5–15.5)
WBC: 5.6 10*3/uL (ref 4.0–10.5)

## 2014-12-06 LAB — ANTITHROMBIN III: ANTITHROMB III FUNC: 77 % (ref 75–120)

## 2014-12-06 LAB — APTT: APTT: 30 s (ref 24–37)

## 2014-12-06 MED ORDER — RIVAROXABAN 15 MG PO TABS
15.0000 mg | ORAL_TABLET | Freq: Once | ORAL | Status: AC
  Administered 2014-12-06: 15 mg via ORAL
  Filled 2014-12-06: qty 1

## 2014-12-06 MED ORDER — XARELTO VTE STARTER PACK 15 & 20 MG PO TBPK: 15.0000 mg | ORAL_TABLET | ORAL | Status: DC

## 2014-12-06 MED ORDER — RIVAROXABAN (XARELTO) EDUCATION KIT FOR DVT/PE PATIENTS
PACK | Freq: Once | Status: DC
  Filled 2014-12-06: qty 1

## 2014-12-06 MED ORDER — IOHEXOL 350 MG/ML SOLN
100.0000 mL | Freq: Once | INTRAVENOUS | Status: AC | PRN
  Administered 2014-12-06: 100 mL via INTRAVENOUS

## 2014-12-06 NOTE — ED Notes (Signed)
Phlebotomy at the bedside  

## 2014-12-06 NOTE — ED Provider Notes (Signed)
CSN: [id number]     Arrival date & time 12/06/14  [id number] History   First MD Initiated Contact with Patient 12/06/14 1006     Chief Complaint  Patient presents with  . DVT  . Leg Pain     (Consider location/radiation/quality/duration/timing/severity/associated sxs/prior Treatment) Patient is a 44 y.o. female presenting with leg pain.  Leg Pain Associated symptoms: no back pain    Patient has been diagnosed with bilateral lower extremity DVTs. Has had swelling or legs for the last week. Worse on the right side. No chest pain or shortness of breath. Had outpatient Doppler that showed DVTs. A couple years ago she had a DVT in her left arm. No family history of clots. She is on a birth control pill. History of hidradenitis. No recent travel. She does not smoke. Past Medical History  Diagnosis Date  . Hidradenitis suppurativa   . DVT (deep venous thrombosis)    Past Surgical History  Procedure Laterality Date  . Axillary hidradenitis excision     No family history on file. History  Substance Use Topics  . Smoking status: Never Smoker   . Smokeless tobacco: Not on file  . Alcohol Use: No   OB History    No data available     Review of Systems  Constitutional: Negative for activity change and appetite change.  Eyes: Negative for pain.  Respiratory: Negative for chest tightness and shortness of breath.   Cardiovascular: Positive for leg swelling. Negative for chest pain.  Gastrointestinal: Negative for nausea, vomiting, abdominal pain and diarrhea.  Genitourinary: Negative for flank pain.  Musculoskeletal: Negative for back pain and neck stiffness.  Skin: Positive for color change and wound. Negative for rash.  Neurological: Negative for weakness, numbness and headaches.  Psychiatric/Behavioral: Negative for behavioral problems.      Allergies  Amoxicillin and Penicillins  Home Medications   Prior to Admission medications   Medication Sig Start Date End Date Taking?  Authorizing Provider  Adalimumab (HUMIRA [place]) Inject into the skin. Every other week    Yes Historical Provider, MD  doxycycline (DORYX) 100 MG DR capsule Take 100 mg by mouth 2 (two) times daily.   Yes Historical Provider, MD  folic acid (FOLVITE) 1 MG tablet Take 1 mg by mouth daily.   Yes Historical Provider, MD  ibuprofen (ADVIL,MOTRIN) 200 MG tablet Take 400 mg by mouth every 6 (six) hours as needed for mild pain.   Yes Historical Provider, MD  methotrexate 2.5 MG tablet Take 10 mg by mouth once a week. Wednesday   Yes Historical Provider, MD  spironolactone (ALDACTONE) 50 MG tablet Take 150 mg by mouth daily.    Yes Historical Provider, MD  XARELTO STARTER PACK 15 & 20 MG TBPK Take 15-20 mg by mouth as directed. Take as directed on package: Start with one 15mg  tablet by mouth twice a day with food. On Day 22, switch to one 20mg  tablet once a day with food. 12/06/14   Benjiman CoreNathan Marshelle Bilger, MD   BP 119/66 mmHg  Pulse 68  Temp(Src) 98.5 F (36.9 C) (Oral)  Resp 22  SpO2 96%  LMP 11/11/2014 (Exact Date) Physical Exam  Constitutional: She appears well-developed and well-nourished.  HENT:  Head: Atraumatic.  Cardiovascular: Normal rate and regular rhythm.   Pulmonary/Chest: Effort normal.  Abdominal: Soft.  Musculoskeletal: She exhibits edema.  Mild edema to bilateral lower legs. Some petechiae. Slight warmth.    ED Course  Procedures (including critical care time) Labs Review Labs Reviewed  CBC WITH DIFFERENTIAL/PLATELET - Abnormal; Notable for the following:    RDW 15.8 (*)    All other components within normal limits  ANTITHROMBIN III  PROTEIN C ACTIVITY  PROTEIN C, TOTAL  PROTEIN S ACTIVITY  PROTEIN S, TOTAL  LUPUS ANTICOAGULANT PANEL  BETA-2-GLYCOPROTEIN I ABS, IGG/M/A  HOMOCYSTEINE  FACTOR 5 LEIDEN  PROTHROMBIN GENE MUTATION  CARDIOLIPIN ANTIBODIES, IGG, IGM, IGA  BASIC METABOLIC PANEL  PROTIME-INR  APTT    Imaging Review Ct Angio Chest Pe W/cm &/or Wo  Cm  12/06/2014   CLINICAL DATA:  44 year old female with a history of leg pain and DVT. No given chest symptoms.  EXAM: CT ANGIOGRAPHY CHEST WITH CONTRAST  TECHNIQUE: Multidetector CT imaging of the chest was performed using the standard protocol during bolus administration of intravenous contrast. Multiplanar CT image reconstructions and MIPs were obtained to evaluate the vascular anatomy.  CONTRAST:  OMNIPAQUE IOHEXOL 350 MG/ML SOLN  COMPARISON:  None.  FINDINGS: Chest:  Superficial soft tissues the chest wall unremarkable.  No adenopathy of the supraclavicular region or axillary region.  Unremarkable appearance of the thoracic inlet.  Central airways are patent with no bronchial wall thickening.  Heart size within normal limits.  No pericardial fluid/ thickening.  Small hiatal hernia.  No mediastinal adenopathy.  Minimal geographic ground-glass opacities of the bilateral lungs. No confluent airspace disease, pneumothorax, or pleural effusion.  Normal course caliber and contour of the thoracic aorta. No dissection flap or aneurysm. Unremarkable appearance of the descending thoracic aorta. Unremarkable appearance of the branch vessels. Incidental made note of common origin of the innominate artery of the left common carotid artery.  No central filling defects. There is a small burden of right-sided pulmonary embolism with filling defects of the lobar branches of the right middle lobe and right lower lobe. No associated CT changes of heart strain.  Upper abdomen:  Cholelithiasis.  No inflammatory changes.  Unremarkable appearance of liver and spleen. Unremarkable appearance of visualized pancreas.  Musculoskeletal:  No displaced fracture.  Review of the MIP images confirms the above findings.  IMPRESSION: CT is positive for pulmonary emboli, with small burden involving right sided lobar branches of the right middle lobe and right lower lobe. No evidence on CT of heart strain.  Cholelithiasis.  These results  were called by telephone at the time of interpretation on 12/06/2014 at 12:24 pm to Dr. Benjiman Core , who verbally acknowledged these results.  Signed,  Yvone Neu. Loreta Ave, DO  [facility]  [facility]   Electronically Signed   By: Gilmer Mor D.O.   On: 12/06/2014 12:25     EKG Interpretation None      MDM   Final diagnoses:  DVT (deep venous thrombosis), bilateral  Pulmonary embolism    Patient presents with DVTs in both of her lower extremities. Palpation Doppler done. She has been on birth control pills. She also has a previous DVT in her left upper extremity. Discussed with Dr. Myna Hidalgo from hematology and patient be started on Xarelto. Hypercoagulable panel was sent. CT angiography was done and showed small pulmonary embolism. No heart strain. No hypoxia, hypotension, or shortness of breath. Started on Xarelto discharge home. Does not appear to need admission at this time. Will follow-up with hematology and her PCP.    Benjiman Core, MD 12/06/14 [phone]

## 2014-12-06 NOTE — Discharge Instructions (Signed)
Deep Vein Thrombosis °A deep vein thrombosis (DVT) is a blood clot that develops in the deep, larger veins of the leg, arm, or pelvis. These are more dangerous than clots that might form in veins near the surface of the body. A DVT can lead to serious and even life-threatening complications if the clot breaks off and travels in the bloodstream to the lungs.  °A DVT can damage the valves in your leg veins so that instead of flowing upward, the blood pools in the lower leg. This is called post-thrombotic syndrome, and it can result in pain, swelling, discoloration, and sores on the leg. °CAUSES °Usually, several things contribute to the formation of blood clots. Contributing factors include: °· The flow of blood slows down. °· The inside of the vein is damaged in some way. °· You have a condition that makes blood clot more easily. °RISK FACTORS °Some people are more likely than others to develop blood clots. Risk factors include:  °· Smoking. °· Being overweight (obese). °· Sitting or lying still for a long time. This includes long-distance travel, paralysis, or recovery from an illness or surgery. °Other factors that increase risk are:  °· Older age, especially over 75 years of age. °· Having a family history of blood clots or if you have already had a blot clot. °· Having major or lengthy surgery. This is especially true for surgery on the hip, knee, or belly (abdomen). Hip surgery is particularly high risk. °· Having a long, thin tube (catheter) placed inside a vein during a medical procedure. °· Breaking a hip or leg. °· Having cancer or cancer treatment. °· Pregnancy and childbirth. °¨ Hormone changes make the blood clot more easily during pregnancy. °¨ The fetus puts pressure on the veins of the pelvis. °¨ There is a risk of injury to veins during delivery or a caesarean delivery. The risk is highest just after childbirth. °· Medicines containing the female hormone estrogen. This includes birth control pills and  hormone replacement therapy. °· Other circulation or heart problems. ° °SIGNS AND SYMPTOMS °When a clot forms, it can either partially or totally block the blood flow in that vein. Symptoms of a DVT can include: °· Swelling of the leg or arm, especially if one side is much worse. °· Warmth and redness of the leg or arm, especially if one side is much worse. °· Pain in an arm or leg. If the clot is in the leg, symptoms may be more noticeable or worse when standing or walking. °The symptoms of a DVT that has traveled to the lungs (pulmonary embolism, PE) usually start suddenly and include: °· Shortness of breath. °· Coughing. °· Coughing up blood or blood-tinged mucus. °· Chest pain. The chest pain is often worse with deep breaths. °· Rapid heartbeat. °Anyone with these symptoms should get emergency medical treatment right away. Do not wait to see if the symptoms will go away. Call your local emergency services (911 in the [place] if you have these symptoms. Do not drive yourself to the hospital. °DIAGNOSIS °If a DVT is suspected, your health care provider will take a full medical history and perform a physical exam. Tests that also may be required include: °· Blood tests, including studies of the clotting properties of the blood. °· Ultrasound to see if you have clots in your legs or lungs. °· X-rays to show the flow of blood when dye is injected into the veins (venogram). °· Studies of your lungs if you have any   chest symptoms. °PREVENTION °· Exercise the legs regularly. Take a brisk 30-minute walk every day. °· Maintain a weight that is appropriate for your height. °· Avoid sitting or lying in bed for long periods of time without moving your legs. °· Women, particularly those over the age of 35 years, should consider the risks and benefits of taking estrogen medicines, including birth control pills. °· Do not smoke, especially if you take estrogen medicines. °· Long-distance travel can increase your risk of DVT. You  should exercise your legs by walking or pumping the muscles every hour. °· Many of the risk factors above relate to situations that exist with hospitalization, either for illness, injury, or elective surgery. Prevention may include medical and nonmedical measures. °· Your health care provider will assess you for the need for venous thromboembolism prevention when you are admitted to the hospital. If you are having surgery, your surgeon will assess you the day of or day after surgery. °TREATMENT °Once identified, a DVT can be treated. It can also be prevented in some circumstances. Once you have had a DVT, you may be at increased risk for a DVT in the future. The most common treatment for DVT is blood-thinning (anticoagulant) medicine, which reduces the blood's tendency to clot. Anticoagulants can stop new blood clots from forming and stop old clots from growing. They cannot dissolve existing clots. Your body does this by itself over time. Anticoagulants can be given by mouth, through an IV tube, or by injection. Your health care provider will determine the best program for you. Other medicines or treatments that may be used are: °· Heparin or related medicines (low molecular weight heparin) are often the first treatment for a blood clot. They act quickly. However, they cannot be taken orally and must be given either in shot form or by IV tube. °· Heparin can cause a fall in a component of blood that stops bleeding and forms blood clots (platelets). You will be monitored with blood tests to be sure this does not occur. °· Warfarin is an anticoagulant that can be swallowed. It takes a few days to start working, so usually heparin or related medicines are used in combination. Once warfarin is working, heparin is usually stopped. °· Factor Xa inhibitor medicines, such as rivaroxaban and apixaban, also reduce blood clotting. These medicines are taken orally and can often be used without heparin or related  medicines. °· Less commonly, clot dissolving drugs (thrombolytics) are used to dissolve a DVT. They carry a high risk of bleeding, so they are used mainly in severe cases where your life or a part of your body is threatened. °· Very rarely, a blood clot in the leg needs to be removed surgically. °· If you are unable to take anticoagulants, your health care provider may arrange for you to have a filter placed in a main vein in your abdomen. This filter prevents clots from traveling to your lungs. °HOME CARE INSTRUCTIONS °· Take all medicines as directed by your health care provider. °· Learn as much as you can about DVT. °· Wear a medical alert bracelet or carry a medical alert card. °· Ask your health care provider how soon you can go back to normal activities. It is important to stay active to prevent blood clots. If you are on anticoagulant medicine, avoid contact sports. °· It is very important to exercise. This is especially important while traveling, sitting, or standing for long periods of time. Exercise your legs by walking or by   tightening and relaxing your leg muscles regularly. Take frequent walks. °· You may need to wear compression stockings. These are tight elastic stockings that apply pressure to the lower legs. This pressure can help keep the blood in the legs from clotting. °Taking Warfarin °Warfarin is a daily medicine that is taken by mouth. Your health care provider will advise you on the length of treatment (usually 3-6 months, sometimes lifelong). If you take warfarin: °· Understand how to take warfarin and foods that can affect how warfarin works in your body. °· Too much and too little warfarin are both dangerous. Too much warfarin increases the risk of bleeding. Too little warfarin continues to allow the risk for blood clots. °Warfarin and Regular Blood Testing °While taking warfarin, you will need to have regular blood tests to measure your blood clotting time. These blood tests usually  include both the prothrombin time (PT) and international normalized ratio (INR) tests. The PT and INR results allow your health care provider to adjust your dose of warfarin. It is very important that you have your PT and INR tested as often as directed by your health care provider.    °Warfarin and Your Diet °Avoid major changes in your diet, or notify your health care provider before changing your diet. Arrange a visit with a registered dietitian to answer your questions. Many foods, especially foods high in vitamin K, can interfere with warfarin and affect the PT and INR results. You should eat a consistent amount of foods high in vitamin K. Foods high in vitamin K include:  °· Spinach, kale, broccoli, cabbage, collard and turnip greens, Brussels sprouts, peas, cauliflower, seaweed, and parsley. °· Beef and pork liver. °· Green tea. °· Soybean oil. °Warfarin with Other Medicines °Many medicines can interfere with warfarin and affect the PT and INR results. You must: °· Tell your health care provider about any and all medicines, vitamins, and supplements you take, including aspirin and other over-the-counter anti-inflammatory medicines. Be especially cautious with aspirin and anti-inflammatory medicines. Ask your health care provider before taking these. °· Do not take or discontinue any prescribed or over-the-counter medicine except on the advice of your health care provider or pharmacist. °Warfarin Side Effects °Warfarin can have side effects, such as easy bruising and difficulty stopping bleeding. Ask your health care provider or pharmacist about other side effects of warfarin. You will need to: °· Hold pressure over cuts for longer than usual. °· Notify your dentist and other health care providers that you are taking warfarin before you undergo any procedures where bleeding may occur. °Warfarin with Alcohol and Tobacco  °· Drinking alcohol frequently can increase the effect of warfarin, leading to excess  bleeding. It is best to avoid alcoholic drinks or to consume only very small amounts while taking warfarin. Notify your health care provider if you change your alcohol intake.   °· Do not use any tobacco products including cigarettes, chewing tobacco, or electronic cigarettes. If you smoke, quit. Ask your health care provider for help with quitting smoking. °Alternative Medicines to Warfarin: Factor Xa Inhibitor Medicines °· These blood-thinning medicines are taken by mouth, usually for several weeks or longer. It is important to take the medicine every single day at the same time each day. °· There are no regular blood tests required when using these medicines. °· There are fewer food and drug interactions than with warfarin. °· The side effects of this class of medicine are similar to those of warfarin, including excessive bruising or bleeding. Ask your   health care provider or pharmacist about other potential side effects. °SEEK MEDICAL CARE IF: °· You notice a rapid heartbeat. °· You feel weaker or more tired than usual. °· You feel faint. °· You notice increased bruising. °· You feel your symptoms are not getting better in the time expected. °· You believe you are having side effects of medicine. °SEEK IMMEDIATE MEDICAL CARE IF: °· You have chest pain. °· You have trouble breathing. °· You have new or increased swelling or pain in one leg. °· You cough up blood. °· You notice blood in vomit, in a bowel movement, or in urine. °MAKE SURE YOU: °· Understand these instructions. °· Will watch your condition. °· Will get help right away if you are not doing well or get worse. °Document Released: 08/02/2005 Document Revised: 12/17/2013 Document Reviewed: 04/09/2013 °[employer]® Patient Information ©2015 [employer]. This information is not intended to replace advice given to you by your health care provider. Make sure you discuss any questions you have with your health care provider. °Pulmonary Embolism °A pulmonary  (lung) embolism (PE) is a blood clot that has traveled to the lung and results in a blockage of blood flow in the affected lung. Most clots come from deep veins in the legs or pelvis. PE is a dangerous and potentially life-threatening condition that can be treated if identified. °CAUSES °Blood clots form in a vein for different reasons. Usually several things cause blood clots. They include: °· The flow of blood slows down. °· The inside of the vein is damaged in some way. °· The person has a condition that makes the blood clot more easily. °RISK FACTORS °Some people are more likely than others to develop PE. Risk factors include:  °· Smoking. °· Being overweight (obese). °· Sitting or lying still for a long time. This includes long-distance travel, paralysis, or recovery from an illness or surgery. °Other factors that increase risk are:  °· Older age, especially over 75 years of age. °· Having a family history of blood clots or if you have already had a blood clot. °· Having major or lengthy surgery. This is especially true for surgery on the hip, knee, or belly (abdomen). Hip surgery is particularly high risk. °· Having a long, thin tube (catheter) placed inside a vein during a medical procedure. °· Breaking a hip or leg. °· Having cancer or cancer treatment. °· Medicines containing the female hormone estrogen. This includes birth control pills and hormone replacement therapy. °· Other circulation or heart problems. °· Pregnancy and childbirth. °¨ Hormone changes make the blood clot more easily during pregnancy. °¨ The fetus puts pressure on the veins of the pelvis. °¨ There is a risk of injury to veins during delivery or a caesarean delivery. The risk is highest just after childbirth.   °PREVENTION  °· Exercise the legs regularly. Take a brisk 30 minute walk every day. °· Maintain a weight that is appropriate for your height. °· Avoid sitting or lying in bed for long periods of time without moving your  legs. °· Women, particularly those over the age of 35 years, should consider the risks and benefits of taking estrogen medicines, including birth control pills. °· Do not smoke, especially if you take estrogen medicines. °· Long-distance travel can increase your risk. You should exercise your legs by walking or pumping the muscles every hour. °· Many of the risk factors above relate to situations that exist with hospitalization, either for illness, injury, or elective surgery. Prevention may include   medical and nonmedical measures.   °¨ Your health care provider will assess you for the need for venous thromboembolism prevention when you are admitted to the hospital. If you are having surgery, your surgeon will assess you the day of or day after surgery.    °SYMPTOMS  °The symptoms of a PE usually start suddenly and include: °· Shortness of breath. °· Coughing. °· Coughing up blood or blood-tinged mucus. °· Chest pain. Pain is often worse with deep breaths. °· Rapid heartbeat. °DIAGNOSIS  °If a PE is suspected, your health care provider will take a medical history and perform a physical exam. Other tests that may be required include: °· Blood tests, such as studies of the clotting properties of your blood. °· Imaging tests, such as ultrasound, CT, MRI, and other tests to see if you have clots in your legs or lungs. °· An electrocardiogram. This can look for heart strain from blood clots in the lungs. °TREATMENT  °· The most common treatment for a PE is blood thinning (anticoagulant) medicine, which reduces the blood's tendency to clot. Anticoagulants can stop new blood clots from forming and old clots from growing. They cannot dissolve existing clots. Your body does this by itself over time. Anticoagulants can be given by mouth, through an intravenous (IV) tube, or by injection. Your health care provider will determine the best program for you. °· Less commonly, clot-dissolving medicines (thrombolytics) are used to  dissolve a PE. They carry a high risk of bleeding, so they are used mainly in severe cases. °· Very rarely, a blood clot in the leg needs to be removed surgically. °· If you are unable to take anticoagulants, your health care provider may arrange for you to have a filter placed in a main vein in your abdomen. This filter prevents clots from traveling to your lungs. °HOME CARE INSTRUCTIONS  °· Take all medicines as directed by your health care provider. °· Learn as much as you can about DVT. °· Wear a medical alert bracelet or carry a medical alert card. °· Ask your health care provider how soon you can go back to normal activities. It is important to stay active to prevent blood clots. If you are on anticoagulant medicine, avoid contact sports. °· It is very important to exercise. This is especially important while traveling, sitting, or standing for long periods of time. Exercise your legs by walking or by tightening and relaxing your leg muscles regularly. Take frequent walks. °· You may need to wear compression stockings. These are tight elastic stockings that apply pressure to the lower legs. This pressure can help keep the blood in the legs from clotting. °Taking Warfarin °Warfarin is a daily medicine that is taken by mouth. Your health care provider will advise you on the length of treatment (usually 3-6 months, sometimes lifelong). If you take warfarin: °· Understand how to take warfarin and foods that can affect how warfarin works in your body. °· Too much and too little warfarin are both dangerous. Too much warfarin increases the risk of bleeding. Too little warfarin continues to allow the risk for blood clots. °Warfarin and Regular Blood Testing °While taking warfarin, you will need to have regular blood tests to measure your blood clotting time. These blood tests usually include both the prothrombin time (PT) and international normalized ratio (INR) tests. The PT and INR results allow your health care  provider to adjust your dose of warfarin. It is very important that you have your PT and INR tested as   often as directed by your health care provider.  °Warfarin and Your Diet °Avoid major changes in your diet, or notify your health care provider before changing your diet. Arrange a visit with a registered dietitian to answer your questions. Many foods, especially foods high in vitamin K, can interfere with warfarin and affect the PT and INR results. You should eat a consistent amount of foods high in vitamin K. Foods high in vitamin K include:  °· Spinach, kale, broccoli, cabbage, collard and turnip greens, Brussels sprouts, peas, cauliflower, seaweed, and parsley. °· Beef and pork liver. °· Green tea. °· Soybean oil. °Warfarin with Other Medicines °Many medicines can interfere with warfarin and affect the PT and INR results. You must: °· Tell your health care provider about any and all medicines, vitamins, and supplements you take, including aspirin and other over-the-counter anti-inflammatory medicines. Be especially cautious with aspirin and anti-inflammatory medicines. Ask your health care provider before taking these. °· Do not take or discontinue any prescribed or over-the-counter medicine except on the advice of your health care provider or pharmacist. °Warfarin Side Effects °Warfarin can have side effects, such as easy bruising and difficulty stopping bleeding. Ask your health care provider or pharmacist about other side effects of warfarin. You will need to: °· Hold pressure over cuts for longer than usual. °· Notify your dentist and other health care providers that you are taking warfarin before you undergo any procedures where bleeding may occur. °Warfarin with Alcohol and Tobacco  °· Drinking alcohol frequently can increase the effect of warfarin, leading to excess bleeding. It is best to avoid alcoholic drinks or consume only very small amounts while taking warfarin. Notify your health care provider if  you change your alcohol intake. °· Do not use any tobacco products including cigarettes, chewing tobacco, or electronic cigarettes. If you smoke, quit. Ask your health care provider for help with quitting smoking. °Alternative Medicines to Warfarin: Factor Xa Inhibitor Medicines °· These blood thinning medicines are taken by mouth, usually for several weeks or longer. It is important to take the medicine every single day, at the same time each day. °· There are no regular blood tests required when using these medicines. °· There are fewer food and drug interactions than with warfarin. °· The side effects of this class of medicine is similar to that of warfarin, including excessive bruising or bleeding. Ask your health care provider or pharmacist about other potential side effects. °SEEK MEDICAL CARE IF:  °· You notice a rapid heartbeat. °· You feel weaker or more tired than usual. °· You feel faint. °· You notice increased bruising. °· Your symptoms are not getting better in the time expected. °· You are having side effects of medicine. °SEEK IMMEDIATE MEDICAL CARE IF:  °· You have chest pain. °· You have trouble breathing. °· You have new or increased swelling or pain in one leg. °· You cough up blood. °· You notice blood in vomit, in a bowel movement, or in urine. °· You have a fever. °Symptoms of PE may represent a serious problem that is an emergency. Do not wait to see if the symptoms will go away. Get medical help right away. Call your local emergency services (911 in the [place]). Do not drive yourself to the hospital.   °Document Released: 07/30/2000 Document Revised: 12/17/2013 Document Reviewed: 08/13/2013 °[employer]® Patient Information ©2015 [employer]. This information is not intended to replace advice given to you by your health care provider. Make sure you discuss any   questions you have with your health care provider. ° °

## 2014-12-06 NOTE — Progress Notes (Signed)
VASCULAR LAB PRELIMINARY  PRELIMINARY  PRELIMINARY  PRELIMINARY  BLEV completed.    Preliminary report:  Positve acute DVT right distal femoral vein, right popliteal and peroneal veins.   Positive acute DVT left peroneal and posterior tibial veins.  Negative Baker's Cyst bilaterally. Patient admitted to ED for further evaluation / treatment.  Loralie ChampagneBishop, Seamus Warehime F, RVT 12/06/2014, 9:46 AM

## 2014-12-06 NOTE — ED Notes (Signed)
Patient transported to CT 

## 2014-12-06 NOTE — ED Notes (Signed)
Called Lab. Stated they had not received blood yet. Phlebotomist Selena BattenKim, who drew the blood, was called and stated she would inquire about the blood and return a call.

## 2014-12-06 NOTE — ED Notes (Signed)
MD at bedside. 

## 2014-12-06 NOTE — ED Notes (Signed)
Pt presents to department for evaluation of bilateral leg DVT's. Pt had positive ultrasound study performed this morning. Reports 8/10 pain to bilateral legs. Pt is alert and oriented x4.

## 2014-12-06 NOTE — ED Notes (Signed)
Patient returned from CT

## 2014-12-06 NOTE — ED Notes (Signed)
Called Lab. Stated Tube Station was down and they are trying to get things straight.

## 2014-12-08 LAB — HOMOCYSTEINE: Homocysteine: 13.3 umol/L (ref 0.0–15.0)

## 2014-12-09 ENCOUNTER — Other Ambulatory Visit (HOSPITAL_COMMUNITY): Payer: Self-pay | Admitting: Hematology & Oncology

## 2014-12-09 LAB — PROTEIN S ACTIVITY: PROTEIN S ACTIVITY: 80 % (ref 60–145)

## 2014-12-09 LAB — BETA-2-GLYCOPROTEIN I ABS, IGG/M/A
Beta-2 Glyco I IgG: <9 GPI IgG units (ref 0–20)
Beta-2-Glycoprotein I IgM: <9 GPI IgM units (ref 0–32)

## 2014-12-09 LAB — FACTOR 5 LEIDEN

## 2014-12-09 LAB — LUPUS ANTICOAGULANT PANEL
DRVVT: 42.1 s (ref 0.0–55.1)
PTT Lupus Anticoagulant: 37.6 s (ref 0.0–50.0)

## 2014-12-09 LAB — PROTEIN S, TOTAL: PROTEIN S AG TOTAL: 113 % (ref 58–150)

## 2014-12-09 LAB — PROTEIN C ACTIVITY: PROTEIN C ACTIVITY: 138 % (ref 74–151)

## 2014-12-10 LAB — PROTEIN C, TOTAL: PROTEIN C, TOTAL: 114 % (ref 70–140)

## 2014-12-10 LAB — CARDIOLIPIN ANTIBODIES, IGG, IGM, IGA: Anticardiolipin IgM: <9 MPL U/mL (ref 0–12)

## 2014-12-10 LAB — PROTHROMBIN GENE MUTATION

## 2014-12-23 ENCOUNTER — Ambulatory Visit (HOSPITAL_COMMUNITY): Payer: BC Managed Care – PPO | Admitting: Hematology & Oncology

## 2014-12-30 ENCOUNTER — Encounter (HOSPITAL_COMMUNITY): Payer: Self-pay | Admitting: Hematology & Oncology

## 2014-12-30 ENCOUNTER — Encounter (HOSPITAL_COMMUNITY): Payer: BC Managed Care – PPO | Attending: Hematology & Oncology | Admitting: Hematology & Oncology

## 2014-12-30 VITALS — BP 127/79 | HR 79 | Temp 98.0°F | Resp 18 | Wt 327.2 lb

## 2014-12-30 DIAGNOSIS — D6869 Other thrombophilia: Secondary | ICD-10-CM

## 2014-12-30 DIAGNOSIS — Z6841 Body Mass Index (BMI) 40.0 and over, adult: Secondary | ICD-10-CM | POA: Insufficient documentation

## 2014-12-30 DIAGNOSIS — Z920 Personal history of contraception: Secondary | ICD-10-CM | POA: Diagnosis not present

## 2014-12-30 DIAGNOSIS — I82403 Acute embolism and thrombosis of unspecified deep veins of lower extremity, bilateral: Secondary | ICD-10-CM | POA: Diagnosis present

## 2014-12-30 DIAGNOSIS — E669 Obesity, unspecified: Secondary | ICD-10-CM | POA: Diagnosis not present

## 2014-12-30 DIAGNOSIS — I2699 Other pulmonary embolism without acute cor pulmonale: Secondary | ICD-10-CM | POA: Diagnosis not present

## 2014-12-30 LAB — CBC WITH DIFFERENTIAL/PLATELET
BASOS PCT: 0 % (ref 0–1)
Basophils Absolute: 0 10*3/uL (ref 0.0–0.1)
Eosinophils Absolute: 0.1 10*3/uL (ref 0.0–0.7)
Eosinophils Relative: 2 % (ref 0–5)
HCT: 40.2 % (ref 36.0–46.0)
Hemoglobin: 13.2 g/dL (ref 12.0–15.0)
Lymphocytes Relative: 29 % (ref 12–46)
Lymphs Abs: 2 10*3/uL (ref 0.7–4.0)
MCH: 31.6 pg (ref 26.0–34.0)
MCHC: 32.8 g/dL (ref 30.0–36.0)
MCV: 96.2 fL (ref 78.0–100.0)
Monocytes Absolute: 0.7 10*3/uL (ref 0.1–1.0)
Monocytes Relative: 11 % (ref 3–12)
NEUTROS PCT: 58 % (ref 43–77)
Neutro Abs: 3.9 10*3/uL (ref 1.7–7.7)
PLATELETS: 206 10*3/uL (ref 150–400)
RBC: 4.18 MIL/uL (ref 3.87–5.11)
RDW: 15 % (ref 11.5–15.5)
WBC: 6.7 10*3/uL (ref 4.0–10.5)

## 2014-12-30 LAB — COMPREHENSIVE METABOLIC PANEL
ALBUMIN: 4.1 g/dL (ref 3.5–5.0)
ALT: 5 U/L — ABNORMAL LOW (ref 14–54)
AST: 18 U/L (ref 15–41)
Alkaline Phosphatase: 53 U/L (ref 38–126)
Anion gap: 7 (ref 5–15)
BUN: 13 mg/dL (ref 6–20)
CO2: 23 mmol/L (ref 22–32)
CREATININE: 0.67 mg/dL (ref 0.44–1.00)
Calcium: 8.8 mg/dL — ABNORMAL LOW (ref 8.9–10.3)
Chloride: 106 mmol/L (ref 101–111)
GFR calc Af Amer: >60 mL/min (ref >60)
GFR calc non Af Amer: >60 mL/min (ref >60)
Glucose, Bld: 88 mg/dL (ref 65–99)
Potassium: 4.1 mmol/L (ref 3.5–5.1)
Sodium: 136 mmol/L (ref 135–145)
Total Bilirubin: 0.7 mg/dL (ref 0.3–1.2)
Total Protein: 8.1 g/dL (ref 6.5–8.1)

## 2014-12-30 MED ORDER — RIVAROXABAN 20 MG PO TABS: 20.0000 mg | ORAL_TABLET | Freq: Every day | ORAL | Status: DC

## 2014-12-30 NOTE — Progress Notes (Signed)
Cheryl HopeNancy E Knight presented for labwork. Labs per MD order drawn via Peripheral Line 25 gauge needle inserted in lt ac  Good blood return present. Procedure without incident.  Needle removed intact. Patient tolerated procedure well.

## 2014-12-30 NOTE — Progress Notes (Signed)
[facility] at [facility] CONSULT NOTE  Patient Care Team: Gareth MorganSteve Knowlton, MD as PCP - General (Family Medicine) Fredricka Bonineasey J Cockerham, PTA as Physical Therapy Assistant (Physical Therapy)  CHIEF COMPLAINTS/PURPOSE OF CONSULTATION:  Bilateral LE DVT's Acute DVT right distal femoral vein, right popliteal and peroneal, diagnosed on 12/06/2014 Acute DVT left distal femoral vein, left popliteal, and peroneal, diagnosed on 12/06/2014 Thrombophilia evaluation negative Small pulmonary embolism  HISTORY OF PRESENTING ILLNESS:  Cheryl Bentley 44 y.o. female is here because of her bilateral acute DVT's. She is unsure of how she ended up with blood clots in both of her legs. 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 So the job change has moved her into a more sedentary routine. Started taking an estrogen based birth control about 2 months before her diagnosis of DVT. She has stopped taking it as advised by the ER doctor.  She reports pain in the RLE. Her boyfriend and her had started a new diet regimen and exercise routine so she originally thought she had just pulled a muscle. The pain persisted and increased and she presented to the ED on 12/05/2014.  No prior injury to her legs other than starting to exercise  15 years ago, she had a surgery where a "nerve" was damaged so she has decreased sensation in her lower left leg. She also has difficulty with plantar flexion. She sees a dermatologist for her Hidradenitis suppurative, her most recent surgery was in December by Dr. Cammy BrochureBercardo. She has had 13 total surgeries for HS. She was originally diagnosed in 1996-1997. She has successfully been on Humira for 5 years now and added Methotrexate to help with flair ups.   She is up to date on her mammograms, the last one being performed about a year ago.  MEDICAL HISTORY:  Past Medical History  Diagnosis Date  . Hidradenitis  suppurativa   . DVT (deep venous thrombosis)     SURGICAL HISTORY: Past Surgical History  Procedure Laterality Date  . Axillary hidradenitis excision      SOCIAL HISTORY: History   Social History  . Marital Status: Significant Other    Spouse Name: N/A  . Number of Children: N/A  . Years of Education: N/A   Occupational History  . Not on file.   Social History Main Topics  . Smoking status: Never Smoker   . Smokeless tobacco: Not on file  . Alcohol Use: No  . Drug Use: No  . Sexual Activity: Not on file   Other Topics Concern  . 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 So the job change has moved her into a more sedentary routine.  Lives with her boyfriend.  No children. ETOH, about once a month with dinner Never smoked. Has been around second hand smoke from her parents and brother.   FAMILY HISTORY: History reviewed. No pertinent family history. has no family status information on file.   Mother, 42 yo, has Stage IV renal disease,  Father, 42 yo, dementia and a. fib stroke.  One brother.  ALLERGIES:  is allergic to amoxicillin and penicillins.  MEDICATIONS:  Current Outpatient Prescriptions  Medication Sig Dispense Refill  . Adalimumab (HUMIRA [place]) Inject into the skin. Every other week     .  doxycycline (DORYX) 100 MG DR capsule Take 100 mg by mouth 2 (two) times daily.    . folic acid (FOLVITE) 1 MG tablet Take 1 mg by mouth daily.    . methotrexate 2.5 MG tablet Take 10 mg by mouth once a week. Wednesday    . silver sulfADIAZINE (SILVADENE) 1 % cream   2  . spironolactone (ALDACTONE) 50 MG tablet Take 150 mg by mouth daily.     Carlena Hurl STARTER PACK 15 & 20 MG TBPK Take 15-20 mg by mouth as directed. Take as directed on package: Start with one  tablet by mouth twice a day with food. On Day 22, switch to one  tablet once a day with food. 51  each 0  . ibuprofen (ADVIL,MOTRIN) 200 MG tablet Take 400 mg by mouth every 6 (six) hours as needed for mild pain.    Marland Kitchen NORTREL 7/7/7 0.5/0.75/1-35 MG-MCG tablet See admin instructions.  3  . rivaroxaban (XARELTO) 20 MG TABS tablet Take 1 tablet (20 mg total) by mouth daily with supper. 30 tablet 4   No current facility-administered medications for this visit.    Review of Systems  Constitutional: Negative.   HENT: Negative.   Eyes: Negative.   Respiratory: Negative.   Cardiovascular: Negative.  Negative for leg swelling.       Previous right calf and right ankle swelling.  Gastrointestinal: Negative for blood in stool.  Genitourinary: Negative.   Musculoskeletal: Negative.   Skin: Negative.   Neurological: Negative.   Endo/Heme/Allergies: Negative.   Psychiatric/Behavioral: Negative.    14 point ROS was done and is otherwise as detailed above or in HPI   PHYSICAL EXAMINATION:  ECOG PERFORMANCE STATUS: 0 - Asymptomatic  Filed Vitals:   12/30/14 1505  BP: 127/79  Pulse: 79  Temp: 98 F (36.7 C)  Resp: 18   Filed Weights   12/30/14 1505  Weight: 327 lb 3.2 oz (148.417 kg)   Physical Exam  Constitutional: She is oriented to person, place, and time and well-developed, well-nourished, and in no distress.  HENT:  Head: Normocephalic and atraumatic.  Nose: Nose normal.  Mouth/Throat: Oropharynx is clear and moist.  Eyes: Conjunctivae and EOM are normal. Pupils are equal, round, and reactive to light.  Neck: Normal range of motion. Neck supple.  Cardiovascular: Normal rate, regular rhythm and normal heart sounds.   Pulmonary/Chest: Effort normal and breath sounds normal.  Abdominal: Soft. Bowel sounds are normal.  Musculoskeletal: Normal range of motion.  Neurological: She is alert and oriented to person, place, and time.  Skin: Skin is warm and dry.       LABORATORY DATA:  I have reviewed the data as listed Lab Results  Component Value Date   WBC 6.7  12/30/2014   HGB 13.2 12/30/2014   HCT 40.2 12/30/2014   MCV 96.2 12/30/2014   PLT 206 12/30/2014  Results for TAKINA, BUSSER (MRN [id number])   Ref. Range 12/06/2014 11:42  Anticardiolipin IgA Latest Ref Range: 0-11 APL U/mL <9  Anticardiolipin IgG Latest Ref Range: 0-14 GPL U/mL <9  Anticardiolipin IgM Latest Ref Range: 0-12 MPL U/mL <9  PTT Lupus Anticoagulant Latest Ref Range: 0.0-50.0 sec 37.6  DRVVT Latest Ref Range: 0.0-55.1 sec 42.1  Lupus Anticoag Interp Unknown Comment:  Beta-2 Glyco I IgG Latest Ref Range: 0-20 GPI IgG units <9  Beta-2-Glycoprotein I IgA Latest Ref Range: 0-25 GPI IgA units <9  Beta-2-Glycoprotein I IgM Latest Ref Range: 0-32 GPI IgM units <9  Antithrombin Activity Latest Ref Range: 75-120 % 77  Recommendations-F5LEID: Unknown Comment  Recommendations-PTGENE: Unknown Comment  Additional Information Unknown Comment  Protein C Activity Latest Ref Range: 74-151 % 138  Protein C, Total Latest Ref Range: 70-140 % 114  Protein S Activity Latest Ref Range: 60-145 % 80  Protein S Ag, Total Latest Ref Range: 58-150 % 113  Prothrombin Time Latest Ref Range: 11.6-15.2 seconds 13.2  INR Latest Ref Range: 0.00-1.49  0.99    RADIOGRAPHIC STUDIES: I have personally reviewed the radiological images as listed and agreed with the findings in the report. CLINICAL DATA: 44 year old female with a history of leg pain and DVT. No given chest symptoms.  EXAM:12/06/2014 CT ANGIOGRAPHY CHEST WITH CONTRAST IMPRESSION: CT is positive for pulmonary emboli, with small burden involving right sided lobar branches of the right middle lobe and right lower lobe. No evidence on CT of heart strain.  Cholelithiasis.  These results were called by telephone at the time of interpretation on 12/06/2014 at 12:24 pm to Dr. Benjiman Core , who verbally acknowledged these results.  Signed,  Yvone Neu. Loreta Ave, DO  [facility]  [facility]  Radiology   Electronically Signed  By: Gilmer Mor D.O.  On: 12/06/2014 12:25  Noninvasive Vascular Lab  Bilateral Lower Extremity Venous Duplex Evaluation Summary:  - Findings consistent with acute deep vein thrombosis involving the very distal superficial femoral, popliteal and peroneal veins of the right lower extremity. - Findings consistent with acute deep vein thrombosis involving the popliteal, posterior tibial and peroneal veins of the left lower extremity. - No evidence of Baker&'s cyst on the right or left.  Patient admitted to ED following exam.  Other specific details can be found in the table(s) above. Prepared and Electronically Authenticated by  Janetta Hora. Fields MD 2016-04-30T17:36:08  ASSESSMENT & PLAN:  Bilateral LE DVT Small PE Xarelto therapy Humira use secondary to HS Recent OCP use Normal thrombophilia evaluation Obesity  I discussed with the patient that it may be the combination of Humira and her oral contraceptives that initiated her clots. I concurred with the emergency room physician that she sit up. Estrogen containing oral contraceptives. We briefly addressed other options. She has a follow-up with her gynecologist in the next several weeks. I advised her that her thrombophilia evaluation thus far is negative.  Thrombosis is reported as a potential side effect of Humira. I am however not that familiar with the actual incidence of thrombosis on this drug. I discussed with the patient I do not feel there is any need for her to discontinue it. I feel more strongly that the oral contraceptives contributed significantly to her bilateral DVTs. I will investigate this further for her.  She is to continue her Xarelto. She will address with her gynecologist other birth control options. We will go ahead and check a CBC and liver function tests today. I will see her back again in several months for additional recommendations and to  determine duration of anticoagulant therapy.  Advised to let us know if she experiences any SOB or leg swelling, even to go to the ER if it is after hours.  Orders Placed This Encounter  Procedures  . CBC with Differential    Standing Status: Future     Number of Occurrences: 1     Standing Expiration Date: 12/30/2015  . Comprehensive metabolic panel    Standing Status: Future     Number of Occurrences: 1     Standing Expiration Date:  12/30/2015    All questions were answered. The patient knows to call the clinic with any problems, questions or concerns.  This note was electronically signed.    This document serves as a record of services personally performed by Loma MessingShannon Leighanne Adolph, MD. It was created on her behalf by Delana MeyerElizabeth Ashley, a trained medical scribe. The creation of this record is based on the scribe's personal observations and the provider's statements to them. This document has been checked and approved by the attending provider.  I have reviewed the above documentation for accuracy and completeness, and I agree with the above. Arvil ChacoPenland,Kyndel Egger Kristen, MD

## 2014-12-30 NOTE — Patient Instructions (Signed)
..  [facility] at [facility] Discharge Instructions  RECOMMENDATIONS MADE BY THE CONSULTANT AND ANY TEST RESULTS WILL BE SENT TO YOUR REFERRING PHYSICIAN.  Labs today  Prescription sent for xarelto  Return in 6 weeks  Thank you for choosing [facility] at [facility] to provide your oncology and hematology care.  To afford each patient quality time with our provider, please arrive at least 15 minutes before your scheduled appointment time.    You need to re-schedule your appointment should you arrive 10 or more minutes late.  We strive to give you quality time with our providers, and arriving late affects you and other patients whose appointments are after yours.  Also, if you no show three or more times for appointments you may be dismissed from the clinic at the providers discretion.     Again, thank you for choosing [facility].  Our hope is that these requests will decrease the amount of time that you wait before being seen by our physicians.       _____________________________________________________________  Should you have questions after your visit to [facility], please contact our office at [phone] between the hours of 8:30 a.m. and 4:30 p.m.  Voicemails left after 4:30 p.m. will not be returned until the following business day.  For prescription refill requests, have your pharmacy contact our office.

## 2015-02-10 ENCOUNTER — Encounter (HOSPITAL_COMMUNITY): Payer: BC Managed Care – PPO | Attending: Hematology & Oncology | Admitting: Hematology & Oncology

## 2015-02-10 ENCOUNTER — Encounter (HOSPITAL_COMMUNITY): Payer: Self-pay | Admitting: Hematology & Oncology

## 2015-02-10 VITALS — BP 114/70 | HR 78 | Temp 98.6°F | Resp 18 | Wt 334.0 lb

## 2015-02-10 DIAGNOSIS — I82403 Acute embolism and thrombosis of unspecified deep veins of lower extremity, bilateral: Secondary | ICD-10-CM | POA: Insufficient documentation

## 2015-02-10 DIAGNOSIS — I2699 Other pulmonary embolism without acute cor pulmonale: Secondary | ICD-10-CM | POA: Insufficient documentation

## 2015-02-10 DIAGNOSIS — E669 Obesity, unspecified: Secondary | ICD-10-CM | POA: Insufficient documentation

## 2015-02-10 DIAGNOSIS — I82409 Acute embolism and thrombosis of unspecified deep veins of unspecified lower extremity: Secondary | ICD-10-CM

## 2015-02-10 DIAGNOSIS — Z6841 Body Mass Index (BMI) 40.0 and over, adult: Secondary | ICD-10-CM | POA: Insufficient documentation

## 2015-02-10 NOTE — Patient Instructions (Addendum)
[facility] at [facility] Discharge Instructions  RECOMMENDATIONS MADE BY THE CONSULTANT AND ANY TEST RESULTS WILL BE SENT TO YOUR REFERRING PHYSICIAN.  Return in 3 months for lab work and office visit.   Thank you for choosing [facility] at [facility] to provide your oncology and hematology care.  To afford each patient quality time with our provider, please arrive at least 15 minutes before your scheduled appointment time.    You need to re-schedule your appointment should you arrive 10 or more minutes late.  We strive to give you quality time with our providers, and arriving late affects you and other patients whose appointments are after yours.  Also, if you no show three or more times for appointments you may be dismissed from the clinic at the providers discretion.     Again, thank you for choosing [facility].  Our hope is that these requests will decrease the amount of time that you wait before being seen by our physicians.       _____________________________________________________________  Should you have questions after your visit to [facility], please contact our office at [phone] between the hours of 8:30 a.m. and 4:30 p.m.  Voicemails left after 4:30 p.m. will not be returned until the following business day.  For prescription refill requests, have your pharmacy contact our office.    

## 2015-02-10 NOTE — Progress Notes (Signed)
[facility] at [facility] PROGRESS NOTE  Patient Care Team: Gareth Morgan, MD as PCP - General (Family Medicine) Fredricka Bonine, PTA as Physical Therapy Assistant (Physical Therapy)  CHIEF COMPLAINTS/PURPOSE OF CONSULTATION:  Bilateral LE DVT's Acute DVT right distal femoral vein, right popliteal and peroneal, diagnosed on 12/06/2014 Acute DVT left distal femoral vein, left popliteal, and peroneal, diagnosed on 12/06/2014 Thrombophilia evaluation negative Small pulmonary embolism  HISTORY OF PRESENTING ILLNESS:  Cheryl Bentley 44 y.o. female is here because of her bilateral acute DVT's. She is currently not on any birth control. She has a follow-up with her gynecologist in the next several weeks. She continues on Humira for her hidradenitis.   Started taking an estrogen based birth control about 2 months before her diagnosis of DVT. She has stopped taking it as advised by the ER doctor.  She is alone today and is doing well.  She has been having heavier periods due to her blood thinners; she has a gynecology appointment, 7/19  The patient denies problems with legs, she is breathing well, no cough, she notes that her feet swell occasionally and is gone in the morning No residual pain, no pain while walking. She has gradually been increasing her physical activity again.  MEDICAL HISTORY:  Past Medical History  Diagnosis Date  . Hidradenitis suppurativa   . DVT (deep venous thrombosis)     SURGICAL HISTORY: Past Surgical History  Procedure Laterality Date  . Axillary hidradenitis excision      SOCIAL HISTORY: History   Social History  . Marital Status: Significant Other    Spouse Name: N/A  . Number of Children: N/A  . Years of Education: N/A   Occupational History  . Not on file.   Social History Main Topics  . Smoking status: Never Smoker   . Smokeless tobacco: Not on file  . Alcohol Use: No  . Drug Use: No  . Sexual Activity: Not on file    Other Topics Concern  . 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 So the job change has moved her into a more sedentary routine.  Lives with her boyfriend.  No children. ETOH, about once a month with dinner Never smoked. Has been around second hand smoke from her parents and brother.   FAMILY HISTORY: History reviewed. No pertinent family history. has no family status information on file.   Mother, 54 yo, has Stage IV renal disease,  Father, 86 yo, dementia and a. fib stroke.  One brother.  ALLERGIES:  is allergic to amoxicillin and penicillins.  MEDICATIONS:  Current Outpatient Prescriptions  Medication Sig Dispense Refill  . Adalimumab (HUMIRA [place]) Inject 40 mg into the skin once a week.     . doxycycline (DORYX) 100 MG DR capsule Take 100 mg by mouth 2 (two) times daily.    . folic acid (FOLVITE) 1 MG tablet Take 1 mg by mouth daily.    Marland Kitchen ibuprofen (ADVIL,MOTRIN) 200 MG tablet Take 400 mg by mouth every 6 (six) hours as needed for mild pain.    . methotrexate 2.5 MG tablet Take 10 mg by mouth once a week. Wednesday    . rivaroxaban (XARELTO) 20 MG TABS tablet Take 1 tablet (20 mg total) by mouth daily with supper. 30 tablet 4  . silver sulfADIAZINE (SILVADENE) 1 % cream  2  . spironolactone (ALDACTONE) 50 MG tablet Take 150 mg by mouth daily.      No current facility-administered medications for this visit.    Review of Systems  Constitutional: Negative.   HENT: Negative.   Eyes: Negative.   Respiratory: Negative.   Cardiovascular: Negative.  Negative for leg swelling.       Previous right calf and right ankle swelling.  Gastrointestinal: Negative for blood in stool.  Genitourinary: Negative.   Musculoskeletal: Negative.   Skin: Negative.   Neurological: Negative.   Endo/Heme/Allergies: Negative.   Psychiatric/Behavioral: Negative.    14 point ROS was  done and is otherwise as detailed above or in HPI   PHYSICAL EXAMINATION:  ECOG PERFORMANCE STATUS: 0 - Asymptomatic  Filed Vitals:   02/10/15 1340  BP: 114/70  Pulse: 78  Temp: 98.6 F (37 C)  Resp: 18   Filed Weights   02/10/15 1340  Weight: 334 lb (151.501 kg)   Physical Exam  Constitutional: She is oriented to person, place, and time and well-developed, well-nourished, and in no distress.  HENT:  Head: Normocephalic and atraumatic.  Nose: Nose normal.  Mouth/Throat: Oropharynx is clear and moist.  Eyes: Conjunctivae and EOM are normal. Pupils are equal, round, and reactive to light.  Neck: Normal range of motion. Neck supple.  Cardiovascular: Normal rate, regular rhythm and normal heart sounds.   Pulmonary/Chest: Effort normal and breath sounds normal.  Abdominal: Soft. Bowel sounds are normal.  Musculoskeletal: Normal range of motion.  Neurological: She is alert and oriented to person, place, and time.  Skin: Skin is warm and dry.       LABORATORY DATA:  I have reviewed the data as listed Lab Results  Component Value Date   WBC 6.7 12/30/2014   HGB 13.2 12/30/2014   HCT 40.2 12/30/2014   MCV 96.2 12/30/2014   PLT 206 12/30/2014  Results for Cheryl HopeKNIGHT, Iona E (MRN [id number])   Ref. Range 12/06/2014 11:42  Anticardiolipin IgA Latest Ref Range: 0-11 APL U/mL <9  Anticardiolipin IgG Latest Ref Range: 0-14 GPL U/mL <9  Anticardiolipin IgM Latest Ref Range: 0-12 MPL U/mL <9  PTT Lupus Anticoagulant Latest Ref Range: 0.0-50.0 sec 37.6  DRVVT Latest Ref Range: 0.0-55.1 sec 42.1  Lupus Anticoag Interp Unknown Comment:  Beta-2 Glyco I IgG Latest Ref Range: 0-20 GPI IgG units <9  Beta-2-Glycoprotein I IgA Latest Ref Range: 0-25 GPI IgA units <9  Beta-2-Glycoprotein I IgM Latest Ref Range: 0-32 GPI IgM units <9  Antithrombin Activity Latest Ref Range: 75-120 % 77  Recommendations-F5LEID: Unknown Comment  Recommendations-PTGENE: Unknown Comment  Additional Information  Unknown Comment  Protein C Activity Latest Ref Range: 74-151 % 138  Protein C, Total Latest Ref Range: 70-140 % 114  Protein S Activity Latest Ref Range: 60-145 % 80  Protein S Ag, Total Latest Ref Range: 58-150 % 113  Prothrombin Time Latest Ref Range: 11.6-15.2 seconds 13.2  INR Latest Ref Range: 0.00-1.49  0.99   Recommendations-F5LEID: Comment   Comments: (NOTE)  Result: Negative (no mutation found)          Recommendations-PTGENE: Comment   Comments: (NOTE)  NEGATIVE  No mutation identified.           RADIOGRAPHIC STUDIES: I have personally reviewed the radiological images as listed and agreed with the findings in the report. CLINICAL DATA: 44 year old female with a history of leg pain and DVT. No given chest symptoms.  EXAM:12/06/2014 CT ANGIOGRAPHY CHEST WITH CONTRAST IMPRESSION: CT is  positive for pulmonary emboli, with small burden involving right sided lobar branches of the right middle lobe and right lower lobe. No evidence on CT of heart strain.  Cholelithiasis.  These results were called by telephone at the time of interpretation on 12/06/2014 at 12:24 pm to Dr. Benjiman Core , who verbally acknowledged these results.  Signed,  Yvone Neu. Loreta Ave, DO  [facility]  [facility] Radiology   Electronically Signed  By: Gilmer Mor D.O.  On: 12/06/2014 12:25  Noninvasive Vascular Lab  Bilateral Lower Extremity Venous Duplex Evaluation Summary:  - Findings consistent with acute deep vein thrombosis involving the very distal superficial femoral, popliteal and peroneal veins of the right lower extremity. - Findings consistent with acute deep vein thrombosis involving the popliteal, posterior tibial and peroneal veins of the left lower extremity. - No evidence of Baker&'s cyst on the right or left.  Patient admitted to ED following exam.  Other specific details can be found in the  table(s) above. Prepared and Electronically Authenticated by  Janetta Hora. Fields MD 2016-04-30T17:36:08  ASSESSMENT & PLAN:  Bilateral LE DVT Small PE Xarelto therapy Humira use secondary to HS Recent OCP use Normal thrombophilia evaluation Obesity  I discussed with the patient that it may be the combination of Humira and her oral contraceptives that initiated her clots. Reviewed her thrombophilia evaluation again and it is negative. I recommended 6 months of anticoagulation. We will check a d-dimer prior to discontinuing her oral Xarelto. I reviewed with her that it is important she does not become pregnant while on Xarelto therapy. She states that they are using birth control and she will discuss other options with her gynecologist at follow-up.  Orders Placed This Encounter  Procedures  . CBC with Differential    Standing Status: Future     Number of Occurrences:      Standing Expiration Date: 02/10/2016  . D-dimer, quantitative    Standing Status: Future     Number of Occurrences:      Standing Expiration Date: 02/10/2016  . Ferritin    Standing Status: Future     Number of Occurrences:      Standing Expiration Date: 02/10/2016    All questions were answered. The patient knows to call the clinic with any problems, questions or concerns. //  This note was electronically signed.    This document serves as a record of services personally performed by Loma Messing, MD. It was created on her behalf by Delana Meyer, a trained medical scribe. The creation of this record is based on the scribe's personal observations and the provider's statements to them. This document has been checked and approved by the attending provider.  I have reviewed the above documentation for accuracy and completeness, and I agree with the above.  Novella Olive. Galen Manila, MD

## 2015-05-07 ENCOUNTER — Telehealth (HOSPITAL_COMMUNITY): Payer: Self-pay | Admitting: Oncology

## 2015-05-07 ENCOUNTER — Other Ambulatory Visit (HOSPITAL_COMMUNITY): Payer: BC Managed Care – PPO

## 2015-05-07 NOTE — Telephone Encounter (Signed)
Patient called requesting that labs to be done at [facility] due to her working at [employer] and being easier for her to stay in [place] for lab testing.  Next lab appt scheduled at [facility].  Message left with patient that her appt is going to be on 05/09/15 at 1215 at the [facility].

## 2015-05-09 ENCOUNTER — Encounter (HOSPITAL_COMMUNITY): Payer: BC Managed Care – PPO | Attending: Hematology & Oncology

## 2015-05-09 ENCOUNTER — Other Ambulatory Visit: Payer: BC Managed Care – PPO

## 2015-05-09 DIAGNOSIS — I82403 Acute embolism and thrombosis of unspecified deep veins of lower extremity, bilateral: Secondary | ICD-10-CM | POA: Diagnosis present

## 2015-05-09 LAB — CBC WITH DIFFERENTIAL/PLATELET
BASOS ABS: 0 10*3/uL (ref 0.0–0.1)
BASOS PCT: 0 %
Eosinophils Absolute: 0.1 10*3/uL (ref 0.0–0.7)
Eosinophils Relative: 1 %
HCT: 41.5 % (ref 36.0–46.0)
Hemoglobin: 13.6 g/dL (ref 12.0–15.0)
Lymphocytes Relative: 30 %
Lymphs Abs: 2.2 10*3/uL (ref 0.7–4.0)
MCH: 32.3 pg (ref 26.0–34.0)
MCHC: 32.8 g/dL (ref 30.0–36.0)
MCV: 98.6 fL (ref 78.0–100.0)
MONO ABS: 0.5 10*3/uL (ref 0.1–1.0)
MONOS PCT: 6 %
Neutro Abs: 4.6 10*3/uL (ref 1.7–7.7)
Neutrophils Relative %: 63 %
PLATELETS: 199 10*3/uL (ref 150–400)
RBC: 4.21 MIL/uL (ref 3.87–5.11)
RDW: 14 % (ref 11.5–15.5)
WBC: 7.4 10*3/uL (ref 4.0–10.5)

## 2015-05-09 LAB — D-DIMER, QUANTITATIVE (NOT AT ARMC)

## 2015-05-09 LAB — FERRITIN: Ferritin: 52 ng/mL (ref 11–307)

## 2015-05-09 NOTE — Progress Notes (Signed)
Labs drawn

## 2015-05-13 ENCOUNTER — Ambulatory Visit (HOSPITAL_COMMUNITY): Payer: BC Managed Care – PPO | Admitting: Hematology & Oncology

## 2015-05-29 ENCOUNTER — Other Ambulatory Visit (HOSPITAL_COMMUNITY): Payer: Self-pay | Admitting: Hematology & Oncology

## 2015-06-02 ENCOUNTER — Encounter (HOSPITAL_COMMUNITY): Payer: BC Managed Care – PPO | Attending: Hematology & Oncology | Admitting: Hematology & Oncology

## 2015-06-02 ENCOUNTER — Encounter (HOSPITAL_COMMUNITY): Payer: Self-pay | Admitting: Hematology & Oncology

## 2015-06-02 VITALS — BP 133/62 | HR 81 | Temp 98.0°F | Resp 20 | Wt 346.8 lb

## 2015-06-02 DIAGNOSIS — Z86718 Personal history of other venous thrombosis and embolism: Secondary | ICD-10-CM

## 2015-06-02 DIAGNOSIS — I82403 Acute embolism and thrombosis of unspecified deep veins of lower extremity, bilateral: Secondary | ICD-10-CM | POA: Insufficient documentation

## 2015-06-02 DIAGNOSIS — I82453 Acute embolism and thrombosis of peroneal vein, bilateral: Secondary | ICD-10-CM

## 2015-06-02 DIAGNOSIS — Z23 Encounter for immunization: Secondary | ICD-10-CM | POA: Diagnosis not present

## 2015-06-02 DIAGNOSIS — Z86711 Personal history of pulmonary embolism: Secondary | ICD-10-CM | POA: Diagnosis not present

## 2015-06-02 DIAGNOSIS — I82413 Acute embolism and thrombosis of femoral vein, bilateral: Secondary | ICD-10-CM | POA: Insufficient documentation

## 2015-06-02 DIAGNOSIS — I2699 Other pulmonary embolism without acute cor pulmonale: Secondary | ICD-10-CM | POA: Insufficient documentation

## 2015-06-02 DIAGNOSIS — I82493 Acute embolism and thrombosis of other specified deep vein of lower extremity, bilateral: Secondary | ICD-10-CM | POA: Insufficient documentation

## 2015-06-02 MED ORDER — INFLUENZA VAC SPLIT QUAD 0.5 ML IM SUSY
0.5000 mL | PREFILLED_SYRINGE | Freq: Once | INTRAMUSCULAR | Status: AC
  Administered 2015-06-02: 0.5 mL via INTRAMUSCULAR

## 2015-06-02 MED ORDER — INFLUENZA VAC SPLIT QUAD 0.5 ML IM SUSY
PREFILLED_SYRINGE | INTRAMUSCULAR | Status: AC
  Filled 2015-06-02: qty 0.5

## 2015-06-02 NOTE — Progress Notes (Signed)
..  Cheryl Bentley presents today for injection per the provider's orders.  Flu vaccine administration without incident; see MAR for injection details.  Patient tolerated procedure well and without incident.  No questions or complaints noted at this time.

## 2015-06-02 NOTE — Patient Instructions (Signed)
[facility] Discharge Instructions  RECOMMENDATIONS MADE BY THE CONSULTANT AND ANY TEST RESULTS WILL BE SENT TO YOUR REFERRING PHYSICIAN.  Exam done and seen today by Dr.Penland Flu shot given today. Follow up 6 months.  Thank you for choosing  [facility] to provide your oncology and hematology care.  To afford each patient quality time with our provider, please arrive at least 15 minutes before your scheduled appointment time.    You need to re-schedule your appointment should you arrive 10 or more minutes late.  We strive to give you quality time with our providers, and arriving late affects you and other patients whose appointments are after yours.  Also, if you no show three or more times for appointments you may be dismissed from the clinic at the providers discretion.     Again, thank you for choosing [facility].  Our hope is that these requests will decrease the amount of time that you wait before being seen by our physicians.       _____________________________________________________________  Should you have questions after your visit to [facility], please contact our office at [phone] between the hours of 8:30 a.m. and 4:30 p.m.  Voicemails left after 4:30 p.m. will not be returned until the following business day.  For prescription refill requests, have your pharmacy contact our office.

## 2015-06-02 NOTE — Progress Notes (Signed)
[facility] at [facility] PROGRESS NOTE  Patient Care Team: Gareth Morgan, MD as PCP - General (Family Medicine) Fredricka Bonine, PTA as Physical Therapy Assistant (Physical Therapy)  CHIEF COMPLAINTS/PURPOSE OF CONSULTATION:  Bilateral LE DVT's Acute DVT right distal femoral vein, right popliteal and peroneal, diagnosed on 12/06/2014 Acute DVT left distal femoral vein, left popliteal, and peroneal, diagnosed on 12/06/2014 Thrombophilia evaluation negative Small pulmonary embolism  HISTORY OF PRESENTING ILLNESS:  Cheryl Bentley 44 y.o. female is here because of her bilateral acute DVT's. She is currently not on any birth control. She has a follow-up with her gynecologist in the next several weeks. She continues on [place] for her hidradenitis.   Started taking an estrogen based birth control about 2 months before her diagnosis of DVT. She has stopped taking it as advised by the ER doctor.  She is alone today and is doing well.  She is still on [place], but no longer takes birth control.  She states that she's still walking and keeping active, and she's been working out with her fiance lately because they both want to lose weight before the wedding.  She denies any more swelling in her legs and denies anything unusual. She confirms that her breathing has been good, with no wheezing.   MEDICAL HISTORY:  Past Medical History  Diagnosis Date  . Hidradenitis suppurativa   . DVT (deep venous thrombosis) (HCC)     SURGICAL HISTORY: Past Surgical History  Procedure Laterality Date  . Axillary hidradenitis excision      SOCIAL HISTORY: Social History   Social History  . Marital Status: Significant Other    Spouse Name: N/A  . Number of Children: N/A  . Years of Education: N/A   Occupational History  . Not on file.   Social History Main Topics  . Smoking status: Never Smoker   . Smokeless tobacco: Not on file  . Alcohol Use: No  . Drug Use: No  .  Sexual Activity: Not on file   Other Topics Concern  . 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 So the job change has moved her into a more sedentary routine.  Lives with her boyfriend.  No children. ETOH, about once a month with dinner Never smoked. Has been around second hand smoke from her parents and brother.   FAMILY HISTORY: Family History  Problem Relation Age of Onset  . Diabetes Mother   . Kidney disease Mother    indicated that her mother is alive. She indicated that her father is alive.   Mother, 26 yo, has Stage IV renal disease,  Father, 64 yo, dementia and a. fib stroke.  One brother.  ALLERGIES:  is allergic to amoxicillin and penicillins.  MEDICATIONS:  Current Outpatient Prescriptions  Medication Sig Dispense Refill  . Adalimumab (HUMIRA [place]) Inject 40 mg into the skin once a week.     . doxycycline (DORYX) 100 MG DR capsule Take 100 mg by mouth 2 (two) times daily.    . folic acid (FOLVITE) 1 MG tablet Take 1 mg by mouth daily.    Marland Kitchen HUMIRA PEN 40 MG/0.8ML PNKT     . ibuprofen (ADVIL,MOTRIN) 200 MG tablet Take 400 mg by mouth every 6 (six) hours as needed for mild pain.    . methotrexate 2.5 MG tablet Take 10 mg  by mouth once a week. Wednesday    . silver sulfADIAZINE (SILVADENE) 1 % cream   2  . spironolactone (ALDACTONE) 50 MG tablet Take 150 mg by mouth daily.     Carlena Hurl. XARELTO 20 MG TABS tablet TAKE 1 TABLET BY MOUTH EVERY DAY WITH SUPPER 30 tablet 0   No current facility-administered medications for this visit.    Review of Systems  Constitutional: Negative.   HENT: Negative.   Eyes: Negative.   Respiratory: Negative.   Cardiovascular: Negative.  Negative for leg swelling.  Gastrointestinal: Negative for blood in stool.  Genitourinary: Negative.   Musculoskeletal: Negative.   Skin: Negative.   Neurological: Negative.   Endo/Heme/Allergies:  Negative.   Psychiatric/Behavioral: Negative.    14 point ROS was done and is otherwise as detailed above or in HPI   PHYSICAL EXAMINATION:  ECOG PERFORMANCE STATUS: 0 - Asymptomatic  Filed Vitals:   06/02/15 1356  BP: 133/62  Pulse: 81  Temp: 98 F (36.7 C)  Resp: 20   Filed Weights   06/02/15 1356  Weight: 346 lb 12.8 oz (157.307 kg)   Physical Exam  Constitutional: She is oriented to person, place, and time and well-developed, well-nourished, and in no distress.  HENT:  Head: Normocephalic and atraumatic.  Nose: Nose normal.  Mouth/Throat: Oropharynx is clear and moist.  Eyes: Conjunctivae and EOM are normal. Pupils are equal, round, and reactive to light.  Neck: Normal range of motion. Neck supple.  Cardiovascular: Normal rate, regular rhythm and normal heart sounds.   Pulmonary/Chest: Effort normal and breath sounds normal.  Abdominal: Soft. Bowel sounds are normal.  Musculoskeletal: Normal range of motion.  Neurological: She is alert and oriented to person, place, and time.  Skin: Skin is warm and dry.     LABORATORY DATA:  I have reviewed the data as listed Lab Results  Component Value Date   WBC 7.4 05/09/2015   HGB 13.6 05/09/2015   HCT 41.5 05/09/2015   MCV 98.6 05/09/2015   PLT 199 05/09/2015  Results for Cheryl HopeKNIGHT, Zamantha E (MRN [id number])   Ref. Range 12/06/2014 11:42  Anticardiolipin IgA Latest Ref Range: 0-11 APL U/mL <9  Anticardiolipin IgG Latest Ref Range: 0-14 GPL U/mL <9  Anticardiolipin IgM Latest Ref Range: 0-12 MPL U/mL <9  PTT Lupus Anticoagulant Latest Ref Range: 0.0-50.0 sec 37.6  DRVVT Latest Ref Range: 0.0-55.1 sec 42.1  Lupus Anticoag Interp Unknown Comment:  Beta-2 Glyco I IgG Latest Ref Range: 0-20 GPI IgG units <9  Beta-2-Glycoprotein I IgA Latest Ref Range: 0-25 GPI IgA units <9  Beta-2-Glycoprotein I IgM Latest Ref Range: 0-32 GPI IgM units <9  Antithrombin Activity Latest Ref Range: 75-120 % 77  Recommendations-F5LEID: Unknown  Comment  Recommendations-PTGENE: Unknown Comment  Additional Information Unknown Comment  Protein C Activity Latest Ref Range: 74-151 % 138  Protein C, Total Latest Ref Range: 70-140 % 114  Protein S Activity Latest Ref Range: 60-145 % 80  Protein S Ag, Total Latest Ref Range: 58-150 % 113  Prothrombin Time Latest Ref Range: 11.6-15.2 seconds 13.2  INR Latest Ref Range: 0.00-1.49  0.99   Recommendations-F5LEID: Comment   Comments: (NOTE)  Result: Negative (no mutation found)          Recommendations-PTGENE: Comment   Comments: (NOTE)  NEGATIVE  No mutation identified.           RADIOGRAPHIC STUDIES: I have personally reviewed the radiological images as listed and agreed with the findings in the report. CLINICAL DATA:  44 year old female with a history of leg pain and DVT. No given chest symptoms.  EXAM:12/06/2014 CT ANGIOGRAPHY CHEST WITH CONTRAST IMPRESSION: CT is positive for pulmonary emboli, with small burden involving right sided lobar branches of the right middle lobe and right lower lobe. No evidence on CT of heart strain.  Cholelithiasis.  These results were called by telephone at the time of interpretation on 12/06/2014 at 12:24 pm to Dr. Benjiman Core , who verbally acknowledged these results.  Signed,  Yvone Neu. Loreta Ave, DO  [facility]  [facility]   Electronically Signed  By: Gilmer Mor D.O.  On: 12/06/2014 12:25  Noninvasive Vascular Lab  Bilateral Lower Extremity Venous Duplex Evaluation Summary:  - Findings consistent with acute deep vein thrombosis involving the very distal superficial femoral, popliteal and peroneal veins of the right lower extremity. - Findings consistent with acute deep vein thrombosis involving the popliteal, posterior tibial and peroneal veins of the left lower extremity. - No evidence of Baker&'s cyst on the right or left.  Patient  admitted to ED following exam.  Other specific details can be found in the table(s) above. Prepared and Electronically Authenticated by  Janetta Hora. Fields MD 2016-04-30T17:36:08  ASSESSMENT & PLAN:  Bilateral LE DVT Small PE Xarelto therapy Humira use secondary to HS Recent OCP use Normal thrombophilia evaluation Obesity  I discussed with the patient that it may have been the combination of Humira and her oral contraceptives that initiated her clots. Reviewed her thrombophilia evaluation again and it is negative. I have recommended that she avoid estrogen containing birth control in the future.  She has completed 6 months of anticoagulation. She is going to discontinue further XARELTO.  D-dimer on 05/09/2015 was <0.27. CBC was all WNL.   She is requesting a flu vaccination today.  I will see her back in 6 months. If she is doing well at that time we will discharge her.  I have encouraged ongoing physical activity and weight loss.  Orders Placed This Encounter  Procedures  . D-dimer, quantitative    Standing Status: Standing     Number of Occurrences: 4     Standing Expiration Date: 06/01/2016    All questions were answered. The patient knows to call the clinic with any problems, questions or concerns. //  This note was electronically signed.    This document serves as a record of services personally performed by Loma Messing, MD. It was created on her behalf by Peggye Fothergill, a trained medical scribe. The creation of this record is based on the scribe's personal observations and the provider's statements to them. This document has been checked and approved by the attending provider.  I have reviewed the above documentation for accuracy and completeness, and I agree with the above.  Novella Olive. Galen Manila, MD

## 2015-06-23 ENCOUNTER — Encounter: Payer: Self-pay | Admitting: *Deleted

## 2015-07-01 ENCOUNTER — Ambulatory Visit (INDEPENDENT_AMBULATORY_CARE_PROVIDER_SITE_OTHER): Payer: BC Managed Care – PPO | Admitting: Advanced Practice Midwife

## 2015-07-01 ENCOUNTER — Encounter: Payer: Self-pay | Admitting: Advanced Practice Midwife

## 2015-07-01 VITALS — BP 116/72 | Ht 72.0 in | Wt 348.0 lb

## 2015-07-01 DIAGNOSIS — Z3202 Encounter for pregnancy test, result negative: Secondary | ICD-10-CM | POA: Diagnosis not present

## 2015-07-01 DIAGNOSIS — Z3200 Encounter for pregnancy test, result unknown: Secondary | ICD-10-CM

## 2015-07-01 DIAGNOSIS — Z3009 Encounter for other general counseling and advice on contraception: Secondary | ICD-10-CM

## 2015-07-01 DIAGNOSIS — Z30014 Encounter for initial prescription of intrauterine contraceptive device: Secondary | ICD-10-CM

## 2015-07-01 LAB — POCT URINE PREGNANCY: PREG TEST UR: NEGATIVE

## 2015-07-01 MED ORDER — MISOPROSTOL 200 MCG PO TABS: ORAL_TABLET | ORAL | Status: DC

## 2015-07-01 NOTE — Progress Notes (Signed)
[facility] Visit  Patient name: Cheryl Bentley MRN [id number]  Date of birth: June 05, 1971  CC & HPI:  MADLINE Bentley is a 44 y.o. Caucasian female presenting today for contraception management..  She had been on COC's for ~ 1 year (first time using birth control) when she developed DVTs/PE.  Her w/u was negative, did not smoke. She was on xarelto for 6 months, used condoms. Now wants contraception.  Does not want BTL ("I've had so many surgeries, I don't want another one.")  Other options discussed.  Would prefer POPs.  Discussed with Dr. Despina Hidden.  Given above scenario, it's just not in pts best interest to go any hormonal route.  Pt will try paragard. SE/risks/benefits discussed. Pt is nulliparous.   Pertinent History Reviewed:  Medical & Surgical Hx:   Past Medical History  Diagnosis Date  . Hidradenitis suppurativa   . DVT (deep venous thrombosis) (HCC)   . Morbid obesity due to excess calories (HCC)   . Depression    Past Surgical History  Procedure Laterality Date  . Axillary hidradenitis excision    . Inguinal hidradenitis excision      multiple surgeries in different areas of the body.    Family History  Problem Relation Age of Onset  . Diabetes Mother   . Kidney disease Mother   . Atrial fibrillation Father   . Cancer Father     skin   . Diabetes Brother   . Cancer Paternal Aunt   . Cancer Paternal Uncle     Current outpatient prescriptions:  .  Adalimumab (HUMIRA [place]), Inject 40 mg into the skin once a week. , Disp: , Rfl:  .  doxycycline (DORYX) 100 MG DR capsule, Take 100 mg by mouth 2 (two) times daily., Disp: , Rfl:  .  folic acid (FOLVITE) 1 MG tablet, Take 1 mg by mouth daily., Disp: , Rfl:  .  ibuprofen (ADVIL,MOTRIN) 200 MG tablet, Take 400 mg by mouth every 6 (six) hours as needed for mild pain., Disp: , Rfl:  .  methotrexate 2.5 MG tablet, Take 10 mg by mouth once a week. Wednesday, Disp: , Rfl:  .  silver sulfADIAZINE (SILVADENE) 1 % cream, , Disp:  , Rfl: 2 .  spironolactone (ALDACTONE) 50 MG tablet, Take 150 mg by mouth daily. , Disp: , Rfl:  .  misoprostol (CYTOTEC) 200 MCG tablet, Take one by mouth and place one in vagina 6 hours before appointment, Disp: 2 tablet, Rfl: 0 Social History: Reviewed -  reports that she has never smoked. She has never used smokeless tobacco.  Review of Systems:    Constitutional: Negative for fever and chills Eyes: Negative for visual disturbances Respiratory: Negative for shortness of breath, dyspnea Cardiovascular: Negative for chest pain or palpitations  Gastrointestinal: Negative for vomiting, diarrhea and constipation; no abdominal pain Genitourinary: Negative for dysuria and urgency, vaginal irritation or itching Musculoskeletal: Negative for back pain, joint pain, myalgias  Neurological: Negative for dizziness and headaches    Objective Findings:  Vitals: BP 116/72 mmHg  Ht 6' (1.829 m)  Wt 348 lb (157.852 kg)  BMI 47.19 kg/m2  LMP 06/11/2015  Physical Examination: General appearance - well appearing, and in no distress Mental status - alert, oriented to person, place, and time Chest:  Normal respiratory effort Heart - normal rate and regular rhythm Musculoskeletal:  Normal range of motion without pain Extremities:  No edema  Results for orders placed or performed in visit on  07/01/15 (from the past 24 hour(s))  POCT urine pregnancy   Collection Time: 07/01/15  9:21 AM  Result Value Ref Range   Preg Test, Ur Negative Negative    50% or more of this visit was spent in counseling and coordination of care.  20 minutes of face to face time.       Assessment & Plan:  A:   Contraception management.   P:  Paragard.   Will plan for insertion to be done while on period, and will premedicate with oral and vaginal cytotec.  Return for paragard IUD with JVF.   CRESENZO-DISHMAN,Brindley Madarang CNM 07/01/2015 9:57 AM

## 2015-08-01 ENCOUNTER — Other Ambulatory Visit (HOSPITAL_COMMUNITY): Payer: BC Managed Care – PPO

## 2015-08-06 ENCOUNTER — Ambulatory Visit: Payer: BC Managed Care – PPO | Admitting: Obstetrics and Gynecology

## 2015-08-07 ENCOUNTER — Encounter (HOSPITAL_COMMUNITY): Payer: Self-pay

## 2015-08-21 ENCOUNTER — Ambulatory Visit: Payer: BC Managed Care – PPO | Admitting: Obstetrics and Gynecology

## 2015-10-23 NOTE — Addendum Note (Signed)
Encounter addended by: Bella Kennedyynthia J Bay Jarquin, PT on: 10/23/2015  1:39 PM<BR>     Documentation filed: Episodes, Clinical Notes

## 2015-12-02 ENCOUNTER — Ambulatory Visit (HOSPITAL_COMMUNITY): Payer: BC Managed Care – PPO | Admitting: Hematology & Oncology

## 2015-12-02 ENCOUNTER — Other Ambulatory Visit (HOSPITAL_COMMUNITY): Payer: BC Managed Care – PPO

## 2015-12-02 NOTE — Progress Notes (Signed)
This encounter was created in error - please disregard.

## 2017-03-02 ENCOUNTER — Ambulatory Visit (HOSPITAL_COMMUNITY)
Admission: EM | Admit: 2017-03-02 | Discharge: 2017-03-02 | Disposition: A | Discharge status: Home / Self Care | Payer: BC Managed Care – PPO | Attending: Internal Medicine | Admitting: Internal Medicine

## 2017-03-02 ENCOUNTER — Encounter (HOSPITAL_COMMUNITY): Payer: Self-pay | Admitting: Emergency Medicine

## 2017-03-02 DIAGNOSIS — M79605 Pain in left leg: Secondary | ICD-10-CM | POA: Diagnosis not present

## 2017-03-02 DIAGNOSIS — W19XXXA Unspecified fall, initial encounter: Secondary | ICD-10-CM

## 2017-03-02 DIAGNOSIS — S8012XA Contusion of left lower leg, initial encounter: Secondary | ICD-10-CM | POA: Diagnosis not present

## 2017-03-02 DIAGNOSIS — S93402A Sprain of unspecified ligament of left ankle, initial encounter: Secondary | ICD-10-CM

## 2017-03-02 MED ORDER — NAPROXEN 500 MG PO TABS: 500.0000 mg | ORAL_TABLET | Freq: Two times a day (BID) | ORAL | 0 refills | Status: AC

## 2017-03-02 NOTE — ED Provider Notes (Signed)
CSN: [id number]     Arrival date & time 03/02/17  1513 History   None    Chief Complaint  Patient presents with  . Fall   (Consider location/radiation/quality/duration/timing/severity/associated sxs/prior Treatment) 46 year old female with history of DVT comes in with 1 week history of left leg pain after falling. She states she tripped and slipped forward hitting her whole left leg. Denies head injury. She has left anterior leg pain with bruising, as well as ankle pain. Has been ambulatory with some pain. She has been taking ibuprofen 800 mg on and off for the pain with good relief. Pain complaint is swelling of the leg causing tightness in pain. She has neuropathy of the left leg, denies worsening of numbness tingling after the fall. Denies shortness of breath, chest pain.      Past Medical History:  Diagnosis Date  . Depression   . DVT (deep venous thrombosis) (HCC)   . Hidradenitis suppurativa   . Morbid obesity due to excess calories [facility])    Past Surgical History:  Procedure Laterality Date  . AXILLARY HIDRADENITIS EXCISION    . INGUINAL HIDRADENITIS EXCISION     multiple surgeries in different areas of the body.    Family History  Problem Relation Age of Onset  . Diabetes Mother   . Kidney disease Mother   . Atrial fibrillation Father   . Cancer Father        skin   . Diabetes Brother   . Cancer Paternal Aunt   . Cancer Paternal Uncle    Social History  Substance Use Topics  . Smoking status: Never Smoker  . Smokeless tobacco: Never Used  . Alcohol use No   OB History    No data available     Review of Systems  Reason unable to perform ROS: HPI as above.    Allergies  Amoxicillin and Penicillins  Home Medications   Prior to Admission medications   Medication Sig Start Date End Date Taking? Authorizing Provider  Adalimumab (HUMIRA [place]) Inject 40 mg into the skin once a week.     [provider]  doxycycline (DORYX) 100 MG DR capsule Take 100  mg by mouth 2 (two) times daily.    [provider]  folic acid (FOLVITE) 1 MG tablet Take 1 mg by mouth daily.    [provider]  ibuprofen (ADVIL,MOTRIN) 200 MG tablet Take 400 mg by mouth every 6 (six) hours as needed for mild pain.    [provider]  methotrexate 2.5 MG tablet Take 10 mg by mouth once a week. Wednesday    [provider]  misoprostol (CYTOTEC) 200 MCG tablet Take one by mouth and place one in vagina 6 hours before appointment 07/01/15   Cresenzo-Dishmon, Scarlette Calico, CNM  naproxen (NAPROSYN) 500 MG tablet Take 1 tablet (500 mg total) by mouth 2 (two) times daily. 03/02/17 03/12/17  Belinda Fisher, PA-C  silver sulfADIAZINE (SILVADENE) 1 % cream  11/06/14   [provider]  spironolactone (ALDACTONE) 50 MG tablet Take 150 mg by mouth daily.     [provider]   Meds Ordered and Administered this Visit  Medications - No data to display  BP (!) 151/94 (BP Location: Right Arm)   Pulse (!) 103   Temp 98.7 F (37.1 C) (Oral)   Resp 18   SpO2 99%  No data found.   Physical Exam  Constitutional: She is oriented to person, place, and time. She appears well-developed and  well-nourished. No distress.  HENT:  Head: Normocephalic and atraumatic.  Eyes: Pupils are equal, round, and reactive to light. Conjunctivae are normal.  Musculoskeletal:  No swelling of the knees. No tenderness on palpation. Full ROM. Strength deferred due to extensive bruising of the left leg.   Swelling and contusion of the anterior left leg noted. Erythema and increased warmth noted. Tenderness on palpation of the left anterior leg. No pain on palpation of the calf area. Negative homans.  Swelling and contusion of the left ankle. Tenderness on palpation of the lateral malleolus. Full ROM, strength normal and equal bilaterally.   Sensation decreased of left leg, stable from neuropathy. Pedal pulses 2+ equal bilaterally   Neurological: She is alert and  oriented to person, place, and time.  Skin: Skin is warm and dry.    Urgent Care Course     Procedures (including critical care time)  Labs Review Labs Reviewed - No data to display  Imaging Review No results found.      MDM   1. Fall, initial encounter   2. Contusion of left lower leg, initial encounter   3. Sprain of left ankle, unspecified ligament, initial encounter    Discussed with patient and spouse, given patient is ambulatory and able to bear weight, low suspicion of fractures. Patient to take Naproxen 500mg  BID x 10 days for pain and inflammation. Ice/heat compress and elevation. Ankle/leg wrap for compression. Patient with erythema and increased warmth around the contusions, given patient already taking doxycycline for hidradenitis, low suspicion for cellulitis. Patient with history of DVT due to exogenous estrogen use, which she has been treated for. She is now off blood thinners and no longer taking birth control, given pain and swelling is of anterior leg, patient without palpable cords, low suspicion for DVT. Patient to monitor for worsening of symptoms, shortness of breath, chest pain, to go to the ED for further evaluation.    Belinda FisherYu, Amy V, PA-C 03/02/17 [phone]

## 2017-03-02 NOTE — ED Triage Notes (Signed)
[facility] a [facility] left leg pain secondary to a fall 1 week ago.

## 2017-03-02 NOTE — Discharge Instructions (Signed)
Take naproxen 500mg  twice a day for 10 days. Ice/heat compress and elevation. Ankle/leg wrap for compression. This can take up to 2-3 weeks to completely resolve, but should be feeling better each week. Monitor for worsening of symptoms, calf pain, chest pain, trouble breathing, increased erythema/warmth, to go to the ED further evaluation.

## 2017-10-07 ENCOUNTER — Other Ambulatory Visit: Payer: Self-pay | Admitting: Family Medicine

## 2017-10-07 DIAGNOSIS — N6310 Unspecified lump in the right breast, unspecified quadrant: Secondary | ICD-10-CM

## 2017-11-09 ENCOUNTER — Other Ambulatory Visit: Payer: Self-pay | Admitting: Family Medicine

## 2017-11-09 DIAGNOSIS — N6310 Unspecified lump in the right breast, unspecified quadrant: Secondary | ICD-10-CM

## 2017-11-17 ENCOUNTER — Other Ambulatory Visit: Payer: BC Managed Care – PPO

## 2017-11-21 ENCOUNTER — Ambulatory Visit
Admission: RE | Admit: 2017-11-21 | Discharge: 2017-11-21 | Disposition: A | Discharge status: Home / Self Care | Payer: BC Managed Care – PPO | Source: Ambulatory Visit | Attending: Family Medicine | Admitting: Family Medicine

## 2017-11-21 ENCOUNTER — Ambulatory Visit: Payer: BC Managed Care – PPO

## 2017-11-21 ENCOUNTER — Other Ambulatory Visit: Payer: BC Managed Care – PPO

## 2017-11-21 DIAGNOSIS — N6310 Unspecified lump in the right breast, unspecified quadrant: Secondary | ICD-10-CM

## 2018-11-06 ENCOUNTER — Other Ambulatory Visit: Payer: Self-pay | Admitting: Family Medicine

## 2018-11-06 DIAGNOSIS — Z1231 Encounter for screening mammogram for malignant neoplasm of breast: Secondary | ICD-10-CM

## 2018-12-12 ENCOUNTER — Ambulatory Visit: Payer: BC Managed Care – PPO

## 2018-12-28 ENCOUNTER — Other Ambulatory Visit: Payer: Self-pay | Admitting: Preventative Medicine

## 2018-12-28 ENCOUNTER — Other Ambulatory Visit: Payer: Self-pay

## 2018-12-28 ENCOUNTER — Ambulatory Visit
Admission: RE | Admit: 2018-12-28 | Discharge: 2018-12-28 | Disposition: A | Discharge status: Home / Self Care | Payer: BC Managed Care – PPO | Source: Ambulatory Visit | Attending: Preventative Medicine | Admitting: Preventative Medicine

## 2018-12-28 ENCOUNTER — Other Ambulatory Visit (HOSPITAL_COMMUNITY)
Admission: RE | Admit: 2018-12-28 | Discharge: 2018-12-28 | Disposition: A | Discharge status: Home / Self Care | Payer: BC Managed Care – PPO | Source: Ambulatory Visit | Attending: Radiology | Admitting: Radiology

## 2018-12-28 DIAGNOSIS — N611 Abscess of the breast and nipple: Secondary | ICD-10-CM

## 2018-12-29 ENCOUNTER — Other Ambulatory Visit: Payer: Self-pay | Admitting: Family Medicine

## 2018-12-29 DIAGNOSIS — N611 Abscess of the breast and nipple: Secondary | ICD-10-CM

## 2018-12-30 ENCOUNTER — Emergency Department (HOSPITAL_COMMUNITY)
Admission: EM | Admit: 2018-12-30 | Discharge: 2018-12-30 | Disposition: A | Discharge status: Home / Self Care | Payer: BC Managed Care – PPO | Attending: Emergency Medicine | Admitting: Emergency Medicine

## 2018-12-30 ENCOUNTER — Other Ambulatory Visit: Payer: Self-pay

## 2018-12-30 ENCOUNTER — Encounter (HOSPITAL_COMMUNITY): Payer: Self-pay | Admitting: *Deleted

## 2018-12-30 DIAGNOSIS — T148XXA Other injury of unspecified body region, initial encounter: Secondary | ICD-10-CM

## 2018-12-30 DIAGNOSIS — L7622 Postprocedural hemorrhage and hematoma of skin and subcutaneous tissue following other procedure: Secondary | ICD-10-CM | POA: Insufficient documentation

## 2018-12-30 HISTORY — DX: Mastitis without abscess: N61.0

## 2018-12-30 NOTE — ED Notes (Signed)
Pressure dsg applied to abscess site on R breast.

## 2018-12-30 NOTE — Discharge Instructions (Addendum)
Keep dressing on until you are rechecked at the breast center. If further bleeding occurs  hold direct pressure over the area for 20 minutes.   Return if any problems

## 2018-12-30 NOTE — ED Triage Notes (Signed)
Pt with bleeding from area to right breast, pt with mastitis and was seen yesterday for it.

## 2018-12-31 NOTE — ED Provider Notes (Signed)
[name] EMERGENCY DEPARTMENT Provider Note   CSN: [id number] Arrival date & time: 12/30/18  1910    History   Chief Complaint Chief Complaint  Patient presents with  . Bleeding/Bruising    HPI Cheryl Bentley is a 48 y.o. female.     Patient reports she recently was diagnosed with mastitis.  She had areas drained 2 days ago.  Patient reports she had an area that she had increased swelling that has opened and drained.  Patient reports she has continued bleeding from that area she has applied pressure at home without relief.  Patient has a past medical history of hidradenitis and has had areas excised from her chest wall.  Patient denies any fever she has not had any chills.  The history is provided by the patient. No language interpreter was used.    Past Medical History:  Diagnosis Date  . Depression   . DVT (deep venous thrombosis) (HCC)   . Hidradenitis suppurativa   . Mastitis   . Morbid obesity due to excess calories [facility](HCC)     Patient Active Problem List   Diagnosis Date Noted  . Deep venous thrombosis (DVT) of both peroneal veins 06/02/2015  . DVT, bilateral lower limbs (HCC) 06/02/2015  . Pulmonary embolus (HCC) 06/02/2015    Past Surgical History:  Procedure Laterality Date  . AXILLARY HIDRADENITIS EXCISION    . INGUINAL HIDRADENITIS EXCISION     multiple surgeries in different areas of the body.      OB History   No obstetric history on file.      Home Medications    Prior to Admission medications   Medication Sig Start Date End Date Taking? Authorizing Provider  Adalimumab (HUMIRA [place]) Inject 40 mg into the skin once a week.     [provider]  doxycycline (DORYX) 100 MG DR capsule Take 100 mg by mouth 2 (two) times daily.    [provider]  folic acid (FOLVITE) 1 MG tablet Take 1 mg by mouth daily.    [provider]  ibuprofen (ADVIL,MOTRIN) 200 MG tablet Take 400 mg by mouth every 6 (six) hours as needed for  mild pain.    [provider]  methotrexate 2.5 MG tablet Take 10 mg by mouth once a week. Wednesday    [provider]  misoprostol (CYTOTEC) 200 MCG tablet Take one by mouth and place one in vagina 6 hours before appointment 07/01/15   Jacklyn Shellresenzo-Dishmon, Frances, CNM  silver sulfADIAZINE (SILVADENE) 1 % cream  11/06/14   [provider]  spironolactone (ALDACTONE) 50 MG tablet Take 150 mg by mouth daily.     [provider]    Family History Family History  Problem Relation Age of Onset  . Diabetes Mother   . Kidney disease Mother   . Atrial fibrillation Father   . Cancer Father        skin   . Diabetes Brother   . Cancer Paternal Aunt   . Cancer Paternal Uncle     Social History Social History   Tobacco Use  . Smoking status: Never Smoker  . Smokeless tobacco: Never Used  Substance Use Topics  . Alcohol use: No  . Drug use: No     Allergies   Amoxicillin and Penicillins   Review of Systems Review of Systems  All other systems reviewed and are negative.    Physical Exam Updated Vital Signs BP 99/85 (BP Location: Left Arm)   Pulse 79  Temp 97.6 F (36.4 C) (Oral)   Resp 18   Ht 6' (1.829 m)   Wt 127 kg   LMP 12/28/2018   SpO2 100%   BMI 37.97 kg/m   Physical Exam Vitals signs and nursing note reviewed.  Constitutional:      Appearance: She is well-developed.  HENT:     Head: Normocephalic.     Nose: Nose normal.     Mouth/Throat:     Mouth: Mucous membranes are moist.  Neck:     Musculoskeletal: Normal range of motion.  Cardiovascular:     Rate and Rhythm: Normal rate.     Comments: Right breast swollen indurated firm to touch.  There is a 1 cm open area or aspect of the right breast has a steady stream of blood.  Area has bled through wound dressing. Pulmonary:     Effort: Pulmonary effort is normal.  Abdominal:     General: There is no distension.  Musculoskeletal: Normal range of motion.  Neurological:      Mental Status: She is alert and oriented to person, place, and time.      ED Treatments / Results  Labs (all labs ordered are listed, but only abnormal results are displayed) Labs Reviewed - No data to display  EKG None  Radiology No results found.  Procedures Procedures (including critical care time)  Medications Ordered in ED Medications - No data to display   Initial Impression / Assessment and Plan / ED Course  I have reviewed the triage vital signs and the nursing notes.  Pertinent labs & imaging results that were available during my care of the patient were reviewed by me and considered in my medical decision making (see chart for details).        I applied Thrombi powder to waiting area and applied dressing.  Patient observed for 45 minutes she had no further bleeding.  Patient is scheduled to see her physician on Monday for recheck.  I advised her to leave dressing in place till evaluated she is to return to the emergency department if symptoms worsen or change  Final Clinical Impressions(s) / ED Diagnoses   Final diagnoses:  Bleeding from wound    ED Discharge Orders    None    An After Visit Summary was printed and given to the patient.    Elson Areas, Cordelia Poche 12/31/18 1820    Derwood Kaplan, MD 01/02/19 2203

## 2019-01-01 ENCOUNTER — Other Ambulatory Visit: Payer: Self-pay

## 2019-01-01 ENCOUNTER — Other Ambulatory Visit: Payer: Self-pay | Admitting: Family Medicine

## 2019-01-01 ENCOUNTER — Ambulatory Visit: Admission: RE | Admit: 2019-01-01 | Payer: BC Managed Care – PPO | Source: Ambulatory Visit

## 2019-01-01 ENCOUNTER — Ambulatory Visit
Admission: RE | Admit: 2019-01-01 | Discharge: 2019-01-01 | Disposition: A | Discharge status: Home / Self Care | Payer: BC Managed Care – PPO | Source: Ambulatory Visit | Attending: Preventative Medicine | Admitting: Preventative Medicine

## 2019-01-01 DIAGNOSIS — N611 Abscess of the breast and nipple: Secondary | ICD-10-CM

## 2019-01-02 LAB — AEROBIC/ANAEROBIC CULTURE W GRAM STAIN (SURGICAL/DEEP WOUND): Culture: NEGATIVE

## 2019-01-09 ENCOUNTER — Other Ambulatory Visit: Payer: BC Managed Care – PPO

## 2019-01-09 ENCOUNTER — Other Ambulatory Visit: Payer: Self-pay | Admitting: Family Medicine

## 2019-01-09 ENCOUNTER — Ambulatory Visit
Admission: RE | Admit: 2019-01-09 | Discharge: 2019-01-09 | Disposition: A | Discharge status: Home / Self Care | Payer: BC Managed Care – PPO | Source: Ambulatory Visit | Attending: Family Medicine | Admitting: Family Medicine

## 2019-01-09 ENCOUNTER — Other Ambulatory Visit: Payer: Self-pay

## 2019-01-09 ENCOUNTER — Inpatient Hospital Stay: Admission: RE | Admit: 2019-01-09 | Payer: BC Managed Care – PPO | Source: Ambulatory Visit

## 2019-01-09 DIAGNOSIS — N611 Abscess of the breast and nipple: Secondary | ICD-10-CM

## 2019-01-18 ENCOUNTER — Ambulatory Visit
Admission: RE | Admit: 2019-01-18 | Discharge: 2019-01-18 | Disposition: A | Discharge status: Home / Self Care | Payer: BC Managed Care – PPO | Source: Ambulatory Visit | Attending: Family Medicine | Admitting: Family Medicine

## 2019-01-18 ENCOUNTER — Ambulatory Visit
Admission: RE | Admit: 2019-01-18 | Discharge: 2019-01-18 | Disposition: A | Discharge status: Home / Self Care | Payer: BC Managed Care – PPO | Source: Ambulatory Visit | Attending: Preventative Medicine | Admitting: Preventative Medicine

## 2019-01-18 ENCOUNTER — Other Ambulatory Visit: Payer: Self-pay

## 2019-01-18 DIAGNOSIS — N611 Abscess of the breast and nipple: Secondary | ICD-10-CM

## 2019-01-26 ENCOUNTER — Other Ambulatory Visit: Payer: Self-pay | Admitting: Family Medicine

## 2019-01-26 DIAGNOSIS — N6310 Unspecified lump in the right breast, unspecified quadrant: Secondary | ICD-10-CM

## 2019-02-02 ENCOUNTER — Ambulatory Visit: Payer: BC Managed Care – PPO

## 2019-04-24 ENCOUNTER — Ambulatory Visit
Admission: RE | Admit: 2019-04-24 | Discharge: 2019-04-24 | Disposition: A | Discharge status: Home / Self Care | Payer: BC Managed Care – PPO | Source: Ambulatory Visit | Attending: Family Medicine | Admitting: Family Medicine

## 2019-04-24 ENCOUNTER — Inpatient Hospital Stay: Admission: RE | Admit: 2019-04-24 | Payer: BC Managed Care – PPO | Source: Ambulatory Visit

## 2019-04-24 ENCOUNTER — Other Ambulatory Visit: Payer: Self-pay | Admitting: Family Medicine

## 2019-04-24 ENCOUNTER — Other Ambulatory Visit: Payer: Self-pay

## 2019-04-24 DIAGNOSIS — N6310 Unspecified lump in the right breast, unspecified quadrant: Secondary | ICD-10-CM

## 2019-04-24 DIAGNOSIS — N6320 Unspecified lump in the left breast, unspecified quadrant: Secondary | ICD-10-CM

## 2019-04-26 ENCOUNTER — Other Ambulatory Visit: Payer: Self-pay | Admitting: Family Medicine

## 2019-04-26 ENCOUNTER — Other Ambulatory Visit: Payer: BC Managed Care – PPO

## 2019-04-26 ENCOUNTER — Ambulatory Visit
Admission: RE | Admit: 2019-04-26 | Discharge: 2019-04-26 | Disposition: A | Discharge status: Home / Self Care | Payer: BC Managed Care – PPO | Source: Ambulatory Visit | Attending: Family Medicine | Admitting: Family Medicine

## 2019-04-26 ENCOUNTER — Other Ambulatory Visit: Payer: Self-pay

## 2019-04-26 DIAGNOSIS — N6320 Unspecified lump in the left breast, unspecified quadrant: Secondary | ICD-10-CM

## 2019-05-01 LAB — AEROBIC/ANAEROBIC CULTURE W GRAM STAIN (SURGICAL/DEEP WOUND): Special Requests: NORMAL

## 2019-05-03 ENCOUNTER — Ambulatory Visit
Admission: RE | Admit: 2019-05-03 | Discharge: 2019-05-03 | Disposition: A | Discharge status: Home / Self Care | Payer: BC Managed Care – PPO | Source: Ambulatory Visit | Attending: Family Medicine | Admitting: Family Medicine

## 2019-05-03 ENCOUNTER — Other Ambulatory Visit: Payer: Self-pay | Admitting: Family Medicine

## 2019-05-03 ENCOUNTER — Other Ambulatory Visit: Payer: Self-pay

## 2019-05-03 DIAGNOSIS — N6320 Unspecified lump in the left breast, unspecified quadrant: Secondary | ICD-10-CM

## 2019-05-03 DIAGNOSIS — N611 Abscess of the breast and nipple: Secondary | ICD-10-CM

## 2019-05-10 ENCOUNTER — Ambulatory Visit
Admission: RE | Admit: 2019-05-10 | Discharge: 2019-05-10 | Disposition: A | Discharge status: Home / Self Care | Payer: BC Managed Care – PPO | Source: Ambulatory Visit | Attending: Family Medicine | Admitting: Family Medicine

## 2019-05-10 ENCOUNTER — Other Ambulatory Visit: Payer: Self-pay

## 2019-05-10 ENCOUNTER — Other Ambulatory Visit: Payer: Self-pay | Admitting: Family Medicine

## 2019-05-10 DIAGNOSIS — N611 Abscess of the breast and nipple: Secondary | ICD-10-CM

## 2019-05-10 DIAGNOSIS — N6320 Unspecified lump in the left breast, unspecified quadrant: Secondary | ICD-10-CM

## 2019-06-07 ENCOUNTER — Ambulatory Visit
Admission: RE | Admit: 2019-06-07 | Discharge: 2019-06-07 | Disposition: A | Discharge status: Home / Self Care | Payer: BC Managed Care – PPO | Source: Ambulatory Visit | Attending: Family Medicine | Admitting: Family Medicine

## 2019-06-07 ENCOUNTER — Other Ambulatory Visit: Payer: Self-pay | Admitting: Family Medicine

## 2019-06-07 ENCOUNTER — Other Ambulatory Visit: Payer: Self-pay

## 2019-06-07 DIAGNOSIS — N611 Abscess of the breast and nipple: Secondary | ICD-10-CM

## 2019-07-09 ENCOUNTER — Other Ambulatory Visit: Payer: Self-pay

## 2019-07-09 ENCOUNTER — Other Ambulatory Visit: Payer: Self-pay | Admitting: Family Medicine

## 2019-07-09 ENCOUNTER — Ambulatory Visit
Admission: RE | Admit: 2019-07-09 | Discharge: 2019-07-09 | Disposition: A | Discharge status: Home / Self Care | Payer: BC Managed Care – PPO | Source: Ambulatory Visit | Attending: Family Medicine | Admitting: Family Medicine

## 2019-07-09 DIAGNOSIS — N611 Abscess of the breast and nipple: Secondary | ICD-10-CM

## 2019-07-14 LAB — AEROBIC/ANAEROBIC CULTURE W GRAM STAIN (SURGICAL/DEEP WOUND): Culture: NO GROWTH

## 2019-07-16 ENCOUNTER — Other Ambulatory Visit: Payer: BC Managed Care – PPO

## 2019-07-23 ENCOUNTER — Ambulatory Visit
Admission: RE | Admit: 2019-07-23 | Discharge: 2019-07-23 | Disposition: A | Discharge status: Home / Self Care | Payer: BC Managed Care – PPO | Source: Ambulatory Visit | Attending: Family Medicine | Admitting: Family Medicine

## 2019-07-23 ENCOUNTER — Other Ambulatory Visit: Payer: Self-pay

## 2019-07-23 DIAGNOSIS — N611 Abscess of the breast and nipple: Secondary | ICD-10-CM

## 2020-01-18 IMAGING — US ULTRASOUND RIGHT BREAST LIMITED
1 series · 13 of 15 positions shown · non-contrast
Comparison: January 01, 2019 and December 28, 2018

CLINICAL DATA: 47-year-old patient with history of hidradenitis
suppurativa (for which she is on humira and methotrexate)and recent
right breast mastitis and abscess, presents for follow-up. She has
recently completed a course of Clindamycin (her third antibiotic for
this breast abscess). She continues to improve clinically. She has
significantly less pain and no new areas of concern. She
occasionally has a small amount of yellowish serous fluid emanate
from the skin of the medial right breast. She had a right breast
ultrasound-guided abscess aspiration on December 28, 2018. She has
chronic scarring of the skin in the inframammary fold regions, that
she reports is related to her hidradenitis suppurative.

EXAM:
ULTRASOUND OF THE RIGHT BREAST

[Series 1: ultrasound right breast limited · 0.07mm/px · 13 of 15 slices shown]
[im 1/15]
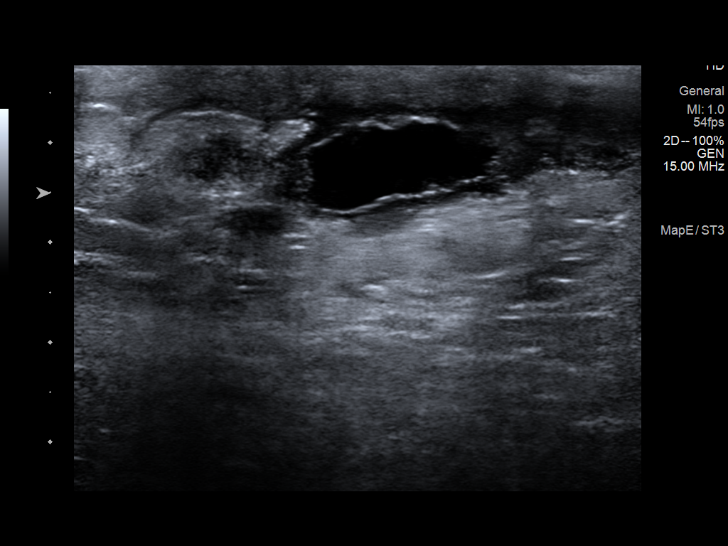
[im 2/15]
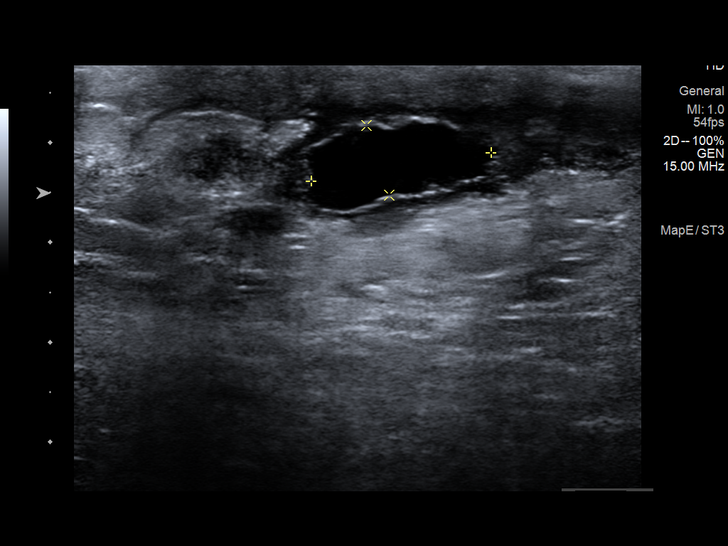
[im 3/15]
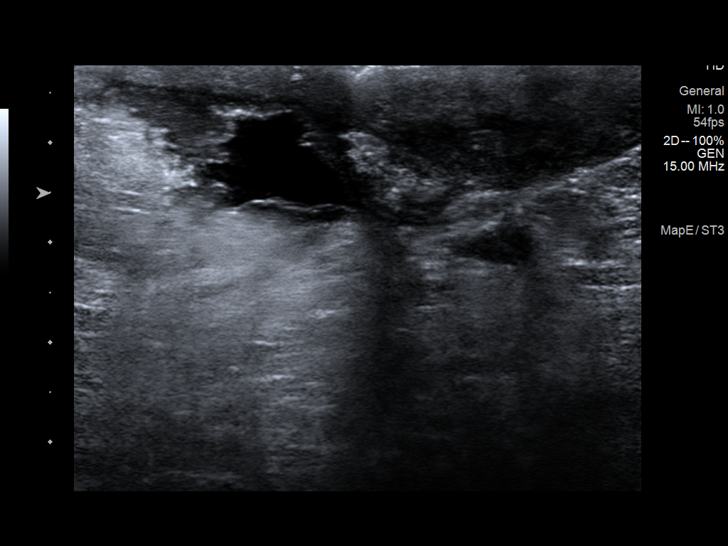
[im 5/15]
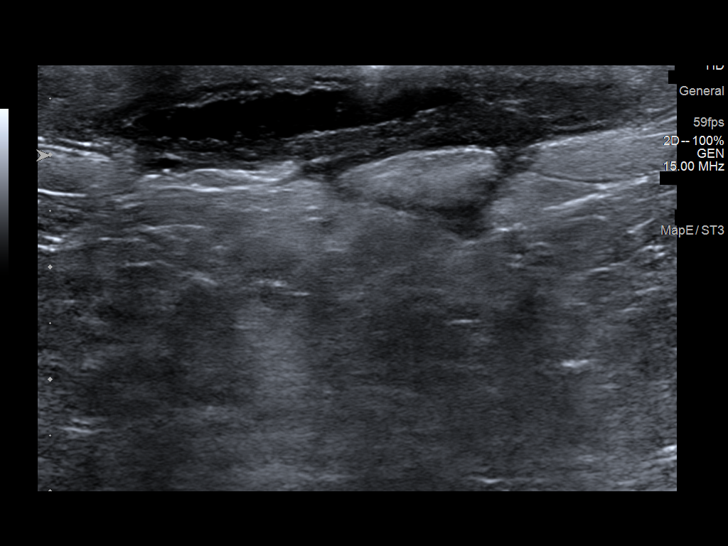
[im 6/15]
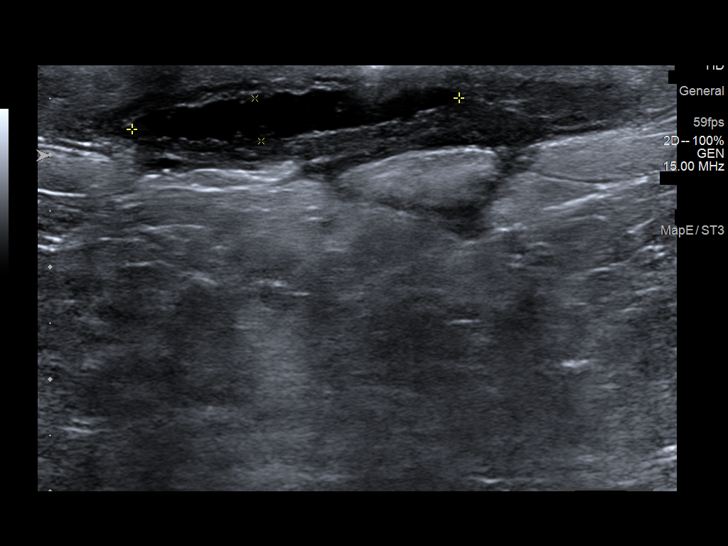
[im 7/15]
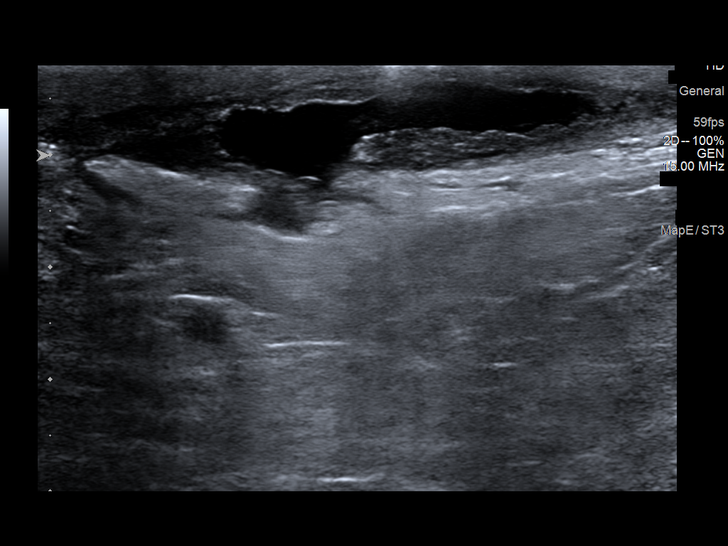
[im 8/15]
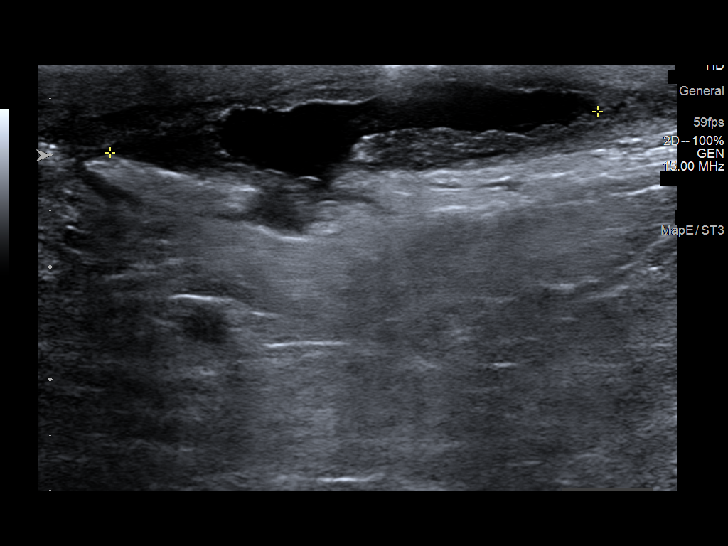
[im 9/15]
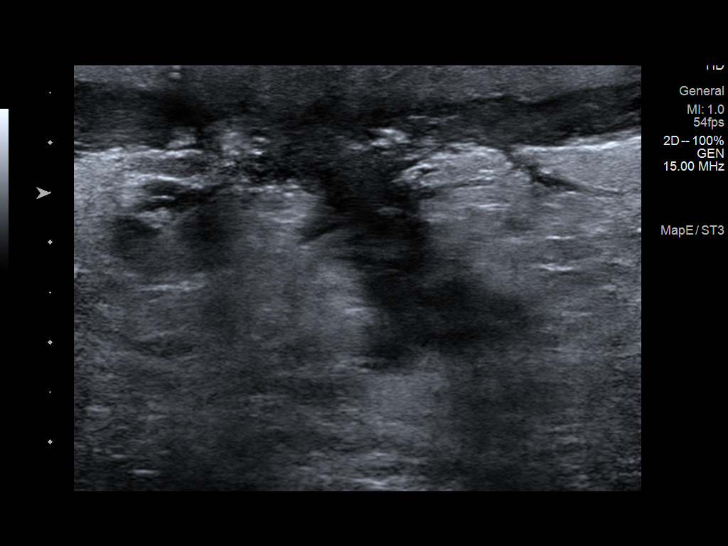
[im 10/15]
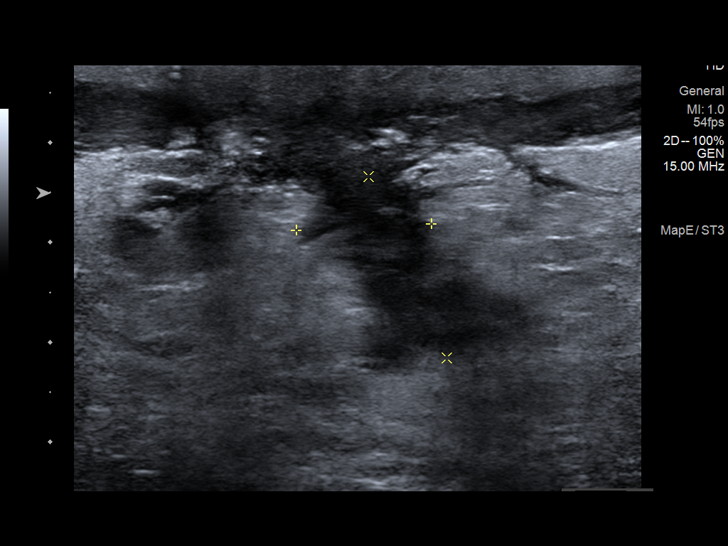
[im 11/15]
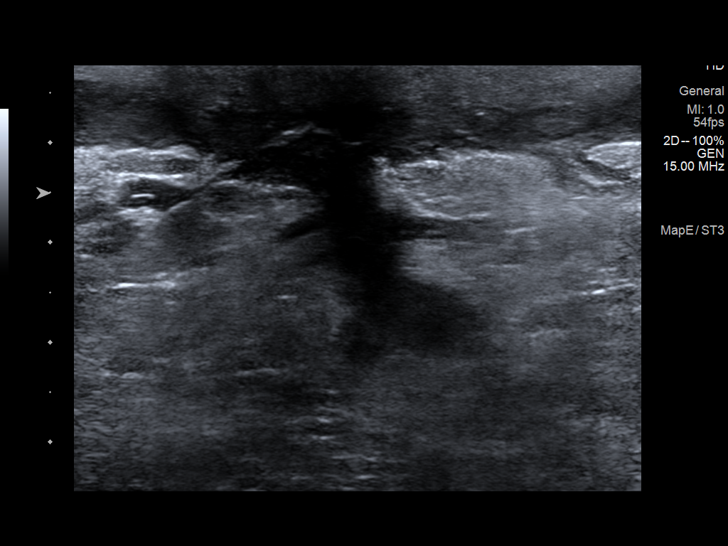
[im 13/15]
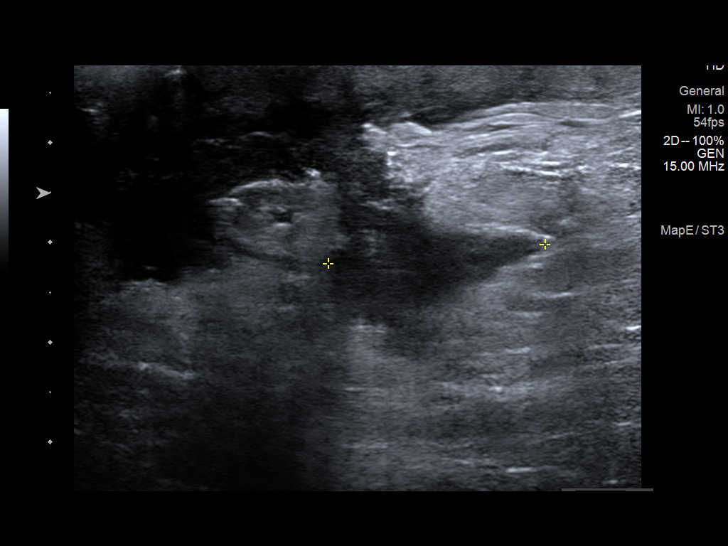
[im 14/15]
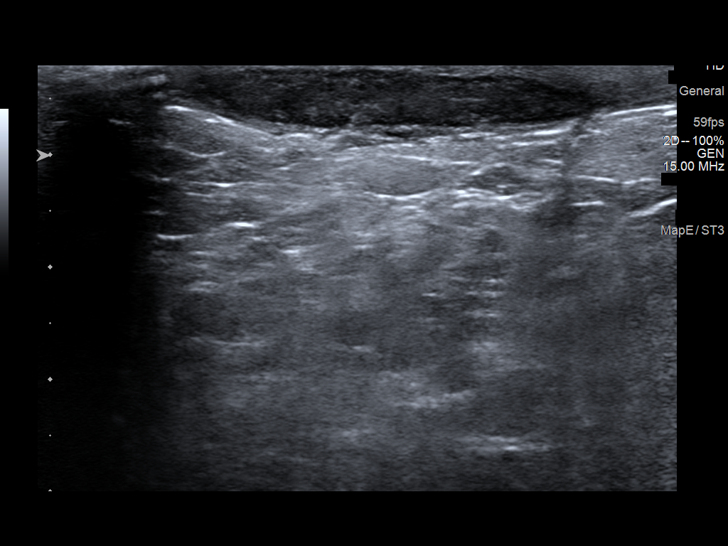
[im 15/15]
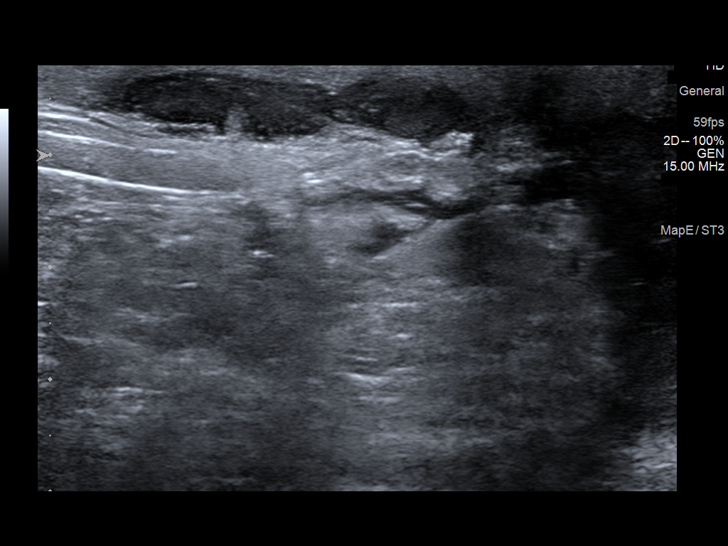

[13 of 15 positions shown; findings below may reference images not displayed]

FINDINGS: On physical exam, there is some chronic scarring of the skin of the
right breast and the inframammary fold regions bilaterally. No
definite erythema, but slight darkening of the skin of the right
breast. No blood or fluid is seen on the skin.

Targeted ultrasound is performed, showing skin thickening of the
right breast that appears slightly improved in the 12 o'clock region
of the right breast compared to the ultrasound January 01, 2019. An
anechoic well-defined fluid collection in the superficial breast
parenchyma at 12 o'clock position measures 1.8 x 0.7 x 1.7 cm.
Previously on January 01, 2019 this measured 3.1 x 1.3 x 2.4 cm.

At 3 o'clock position 3 cm from the nipple is some nearly anechoic
fluid within the thickened skin. This fluid collection does not
extend into the subcutaneous breast parenchyma. This area measures
2.9 x 0.4 x 4.4 cm and is likely evolution of infection within the
skin. Currently however, there is no residual measurable complex
fluid collection within the 9 o'clock position of the right breast
parenchyma.

In the 6 o'clock position 1 cm from the nipple is a hypoechoic area
likely reflecting phlegmon that appears similar to the ultrasound
this region.
IMPRESSION: Continue clinical improvement of the right breast per patient
report. On ultrasound, there is some evolution of infection,
consistent with healing, with no drainable abscess in the parenchyma
of the breast and no suspicious findings for progression of
infection.

RECOMMENDATION:
Patient continues to be due for a mammogram. She believes that she
would be able to tolerate a mammogram approximately 1 week. We have
planned for a follow-up bilateral diagnostic mammogram and right
breast ultrasound next week. The patient is aware that she can
return sooner if needed.

I have discussed the findings and recommendations with the patient.
Results were also provided in writing at the conclusion of the
visit. If applicable, a reminder letter will be sent to the patient
regarding the next appointment.

BI-RADS CATEGORY  2: Benign.

## 2020-01-27 IMAGING — US ULTRASOUND RIGHT BREAST LIMITED
1 series · 12 of 12 positions shown · non-contrast
Comparison: Previous exam(s).

CLINICAL DATA: Patient with history right breast abscess, for
follow-up evaluation. Patient was initially seen on 12/28/2018 with
breast aspiration performed. Additionally, antibiotics were changed
at that time to clindamycin. Patient with subsequently seen on
01/01/2019 and 01/09/2019 with continued improvement of symptoms.
There were residual fluid collections, some within the skin however
these were not extremely complex and felt to be sequelae of
resolving infection.

EXAM:
DIGITAL DIAGNOSTIC BILATERAL MAMMOGRAM WITH CAD AND TOMO
ULTRASOUND RIGHT BREAST

[Series 1: ultrasound right breast limited · 0.06mm/px · 12 of 12 slices shown]
[im 1/12]
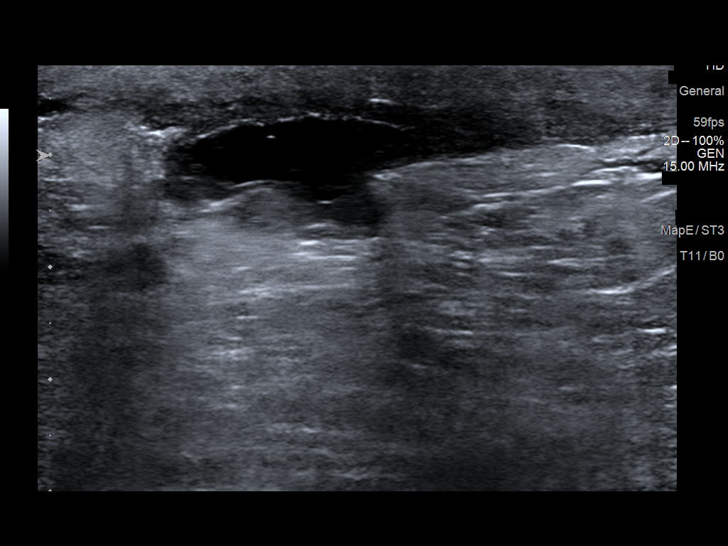
[im 2/12]
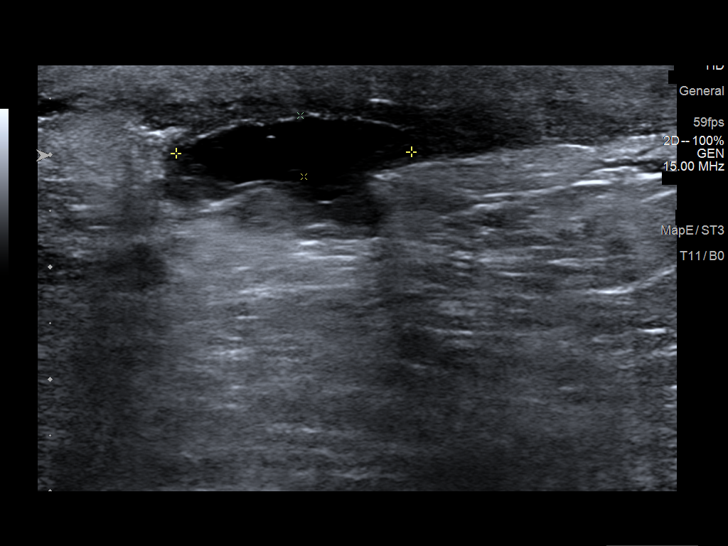
[im 3/12]
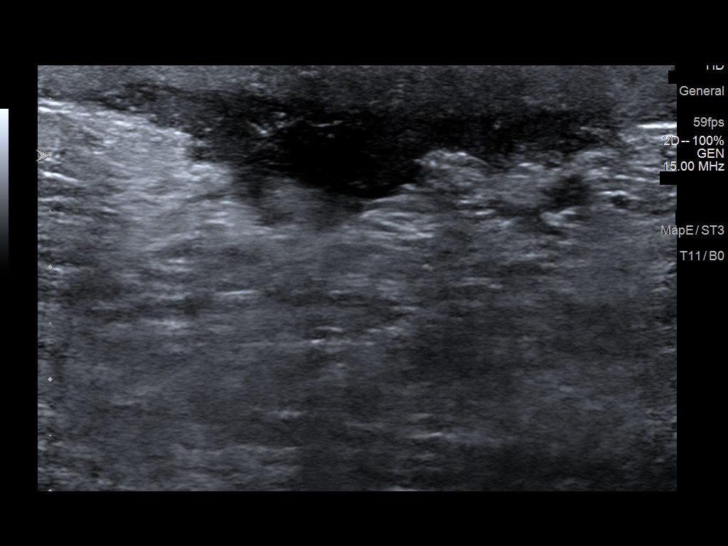
[im 4/12]
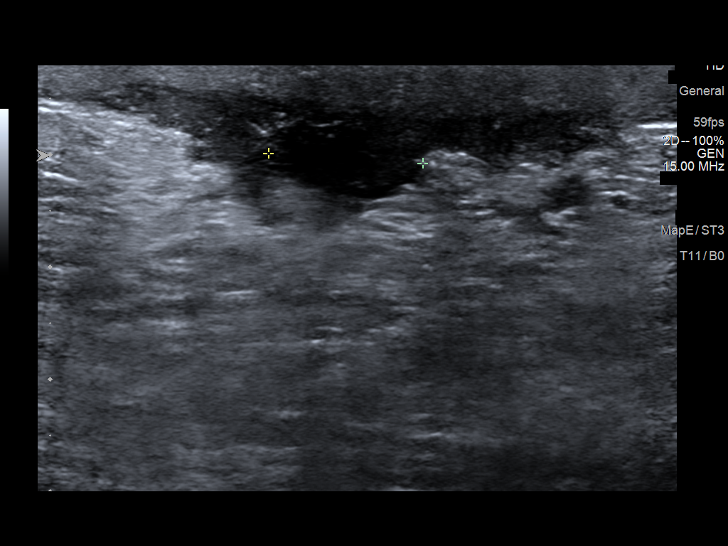
[im 5/12]
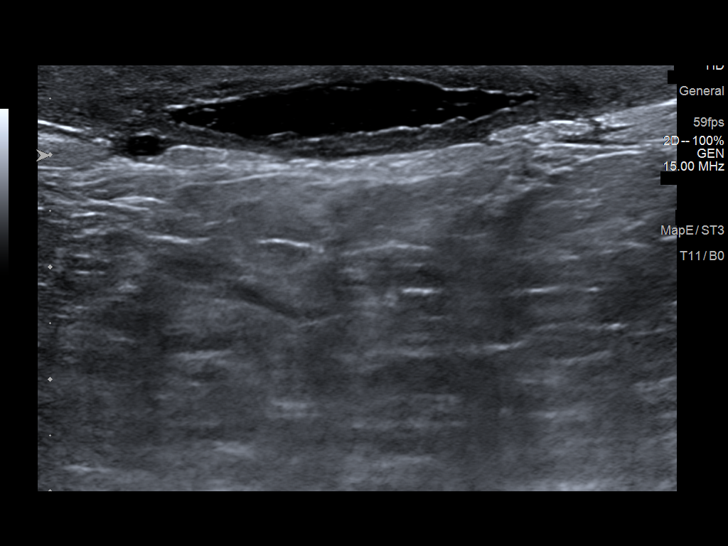
[im 6/12]
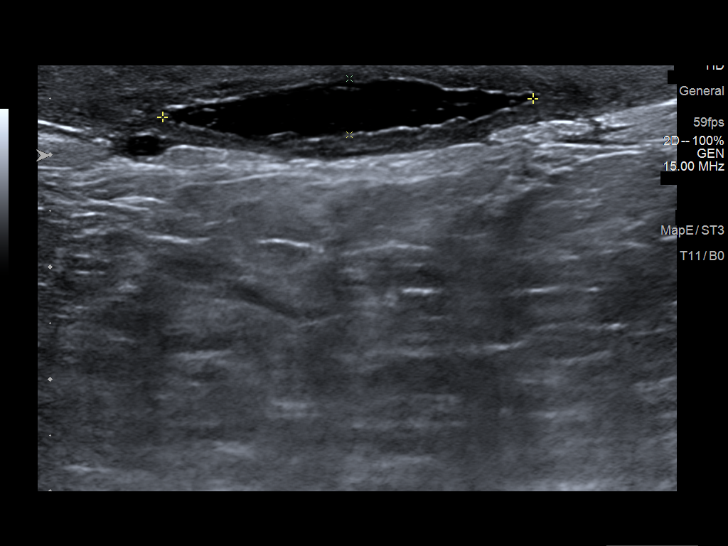
[im 7/12]
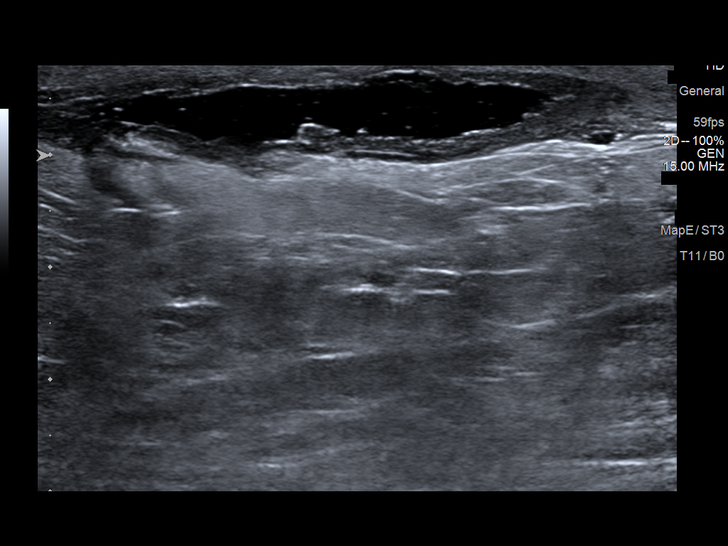
[im 8/12]
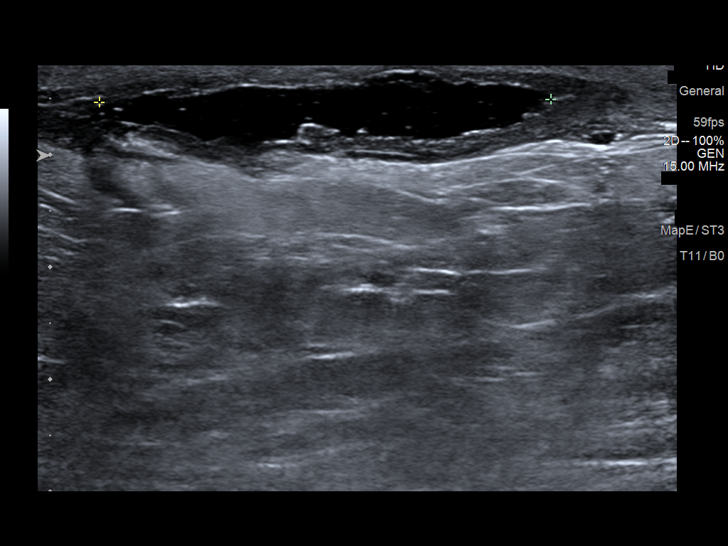
[im 9/12]
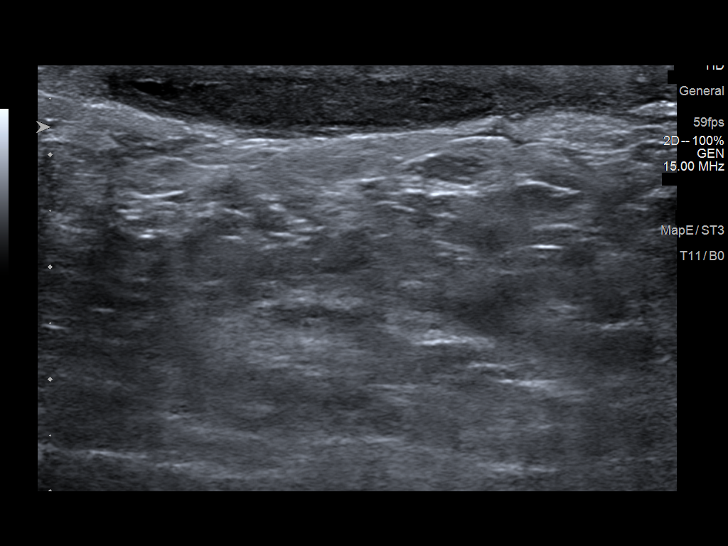
[im 10/12]
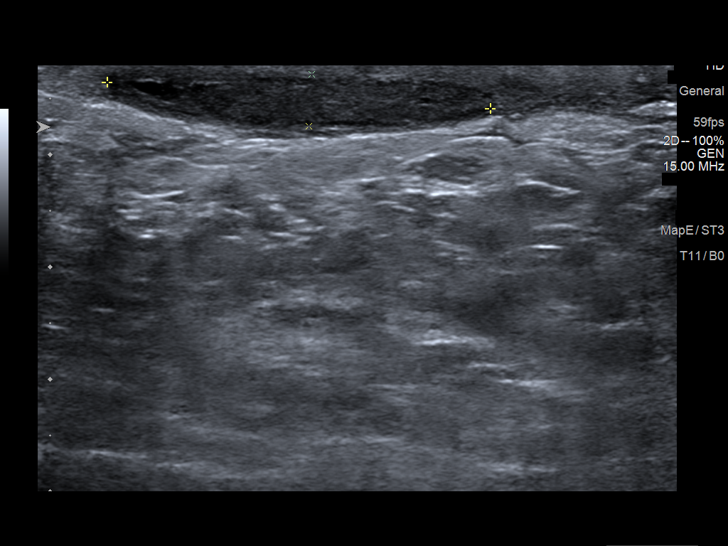
[im 11/12]
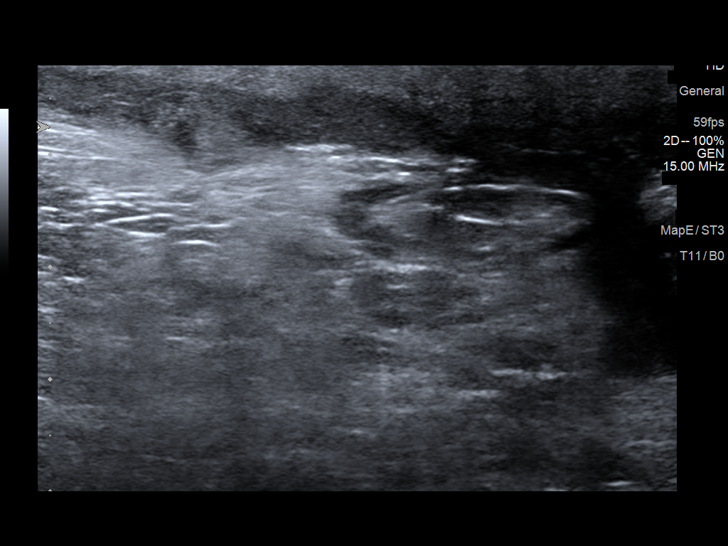
[im 12/12]
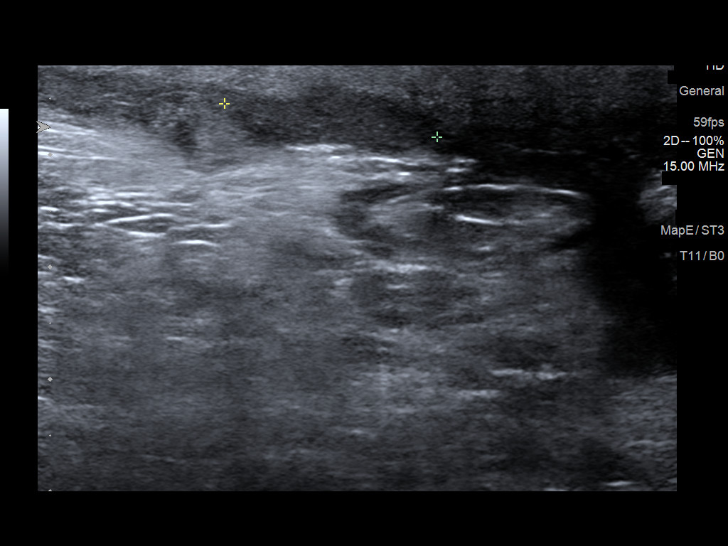

[12 of 12 positions shown; findings below may reference images not displayed]

ACR Breast Density Category b: There are scattered areas of
fibroglandular density.
FINDINGS: There is skin thickening within the anterior right breast with
parenchymal and trabecular thickening within the retroareolar right
breast. No additional masses, calcifications or distortion
identified within either breast.

Mammographic images were processed with CAD.

On physical exam, there is chronic scarring of the skin within the
right breast and inframammary region of the right breast. No
significant skin redness.

Targeted ultrasound is performed, showing a similar-appearing 2.1 x
0.6 x 1.4 cm near anechoic fluid collection right breast 12 o'clock
position 1 cm from the nipple just deep to the thickened skin.

Within the right breast 3 o'clock position 2 cm from nipple there is
a similar-appearing 3.3 x 0.5 x 4.0 cm near anechoic simple
appearing fluid collection located within the thickened skin. This
previously measured 2.9 x 0.4 x 4.3 cm.

Within the 9 o'clock position right breast 2 cm from nipple there is
a 3.4 x 0.5 x 1.9 cm hypoechoic region within the thickened skin
which is similar in appearance to prior ultrasound.
IMPRESSION: 1. Residual simple appearing fluid collections within the right
breast 12 o'clock position and 3 o'clock position, similar in size
and appearance when compared with recent prior ultrasound.
2. Similar-appearing hypoechoic collection within the right breast 9
o'clock position which may represent phlegmonous change or healing
abscess.
3. Patient does not report any clinical manifestations of infection
at this time.

RECOMMENDATION:
Given the persistent yet simple appearing fluid collections within
the right breast as described above and no current clinical
manifestations of infection, patient will remain off antibiotic
therapy. Patient will be scheduled with Dr. Minobu in
approximately 1 week for further evaluation given the location of
the fluid collections within the right breast in the skin. Patient
was instructed to call the office if clinical signs or symptoms of
infection return. Recommend continued clinical follow-up until
resolution.

Additionally, recommend follow-up right breast diagnostic mammogram
and ultrasound in 3 months to assess complete resolution.

I have discussed the findings and recommendations with the patient.
Results were also provided in writing at the conclusion of the
visit. If applicable, a reminder letter will be sent to the patient
regarding the next appointment.

BI-RADS CATEGORY  3: Probably benign.

## 2020-02-25 ENCOUNTER — Other Ambulatory Visit: Payer: Self-pay | Admitting: Orthopedic Surgery

## 2020-02-25 DIAGNOSIS — S83249S Other tear of medial meniscus, current injury, unspecified knee, sequela: Secondary | ICD-10-CM

## 2020-02-27 ENCOUNTER — Other Ambulatory Visit: Payer: Self-pay

## 2020-02-27 ENCOUNTER — Ambulatory Visit
Admission: RE | Admit: 2020-02-27 | Discharge: 2020-02-27 | Disposition: A | Discharge status: Home / Self Care | Payer: BC Managed Care – PPO | Source: Ambulatory Visit | Attending: Orthopedic Surgery | Admitting: Orthopedic Surgery

## 2020-02-27 ENCOUNTER — Other Ambulatory Visit: Payer: Self-pay | Admitting: Family Medicine

## 2020-02-27 DIAGNOSIS — Z1231 Encounter for screening mammogram for malignant neoplasm of breast: Secondary | ICD-10-CM

## 2020-02-27 DIAGNOSIS — S83249S Other tear of medial meniscus, current injury, unspecified knee, sequela: Secondary | ICD-10-CM

## 2020-03-13 ENCOUNTER — Other Ambulatory Visit: Payer: Self-pay | Admitting: Sports Medicine

## 2020-03-13 DIAGNOSIS — M545 Low back pain, unspecified: Secondary | ICD-10-CM

## 2020-03-28 ENCOUNTER — Other Ambulatory Visit: Payer: Self-pay

## 2020-03-28 ENCOUNTER — Ambulatory Visit
Admission: RE | Admit: 2020-03-28 | Discharge: 2020-03-28 | Disposition: A | Discharge status: Home / Self Care | Payer: BC Managed Care – PPO | Source: Ambulatory Visit | Attending: Sports Medicine | Admitting: Sports Medicine

## 2020-03-28 DIAGNOSIS — M545 Low back pain, unspecified: Secondary | ICD-10-CM

## 2020-04-05 ENCOUNTER — Other Ambulatory Visit: Payer: BC Managed Care – PPO

## 2020-04-14 ENCOUNTER — Other Ambulatory Visit: Payer: Self-pay | Admitting: Family Medicine

## 2020-04-14 DIAGNOSIS — N6001 Solitary cyst of right breast: Secondary | ICD-10-CM

## 2020-04-17 ENCOUNTER — Other Ambulatory Visit: Payer: Self-pay | Admitting: Family Medicine

## 2020-04-17 ENCOUNTER — Other Ambulatory Visit: Payer: Self-pay

## 2020-04-17 ENCOUNTER — Ambulatory Visit
Admission: RE | Admit: 2020-04-17 | Discharge: 2020-04-17 | Disposition: A | Discharge status: Home / Self Care | Payer: BC Managed Care – PPO | Source: Ambulatory Visit | Attending: Family Medicine | Admitting: Family Medicine

## 2020-04-17 DIAGNOSIS — N6310 Unspecified lump in the right breast, unspecified quadrant: Secondary | ICD-10-CM

## 2020-04-17 DIAGNOSIS — N6001 Solitary cyst of right breast: Secondary | ICD-10-CM

## 2020-04-23 LAB — AEROBIC/ANAEROBIC CULTURE W GRAM STAIN (SURGICAL/DEEP WOUND): Culture: NEGATIVE

## 2020-04-24 ENCOUNTER — Ambulatory Visit: Payer: BC Managed Care – PPO

## 2020-04-25 ENCOUNTER — Ambulatory Visit
Admission: RE | Admit: 2020-04-25 | Discharge: 2020-04-25 | Disposition: A | Discharge status: Home / Self Care | Payer: BC Managed Care – PPO | Source: Ambulatory Visit | Attending: Family Medicine | Admitting: Family Medicine

## 2020-04-25 ENCOUNTER — Other Ambulatory Visit: Payer: Self-pay

## 2020-04-25 ENCOUNTER — Other Ambulatory Visit: Payer: Self-pay | Admitting: Family Medicine

## 2020-04-25 DIAGNOSIS — N6310 Unspecified lump in the right breast, unspecified quadrant: Secondary | ICD-10-CM

## 2020-04-25 DIAGNOSIS — N6001 Solitary cyst of right breast: Secondary | ICD-10-CM

## 2020-05-09 ENCOUNTER — Ambulatory Visit
Admission: RE | Admit: 2020-05-09 | Discharge: 2020-05-09 | Disposition: A | Discharge status: Home / Self Care | Payer: BC Managed Care – PPO | Source: Ambulatory Visit | Attending: Family Medicine | Admitting: Family Medicine

## 2020-05-09 ENCOUNTER — Other Ambulatory Visit: Payer: Self-pay | Admitting: Family Medicine

## 2020-05-09 ENCOUNTER — Other Ambulatory Visit: Payer: Self-pay

## 2020-05-09 DIAGNOSIS — N6310 Unspecified lump in the right breast, unspecified quadrant: Secondary | ICD-10-CM

## 2020-05-11 IMAGING — US US BREAST*R* LIMITED INC AXILLA
1 series · 4 of 4 positions shown · non-contrast
Comparison: Multiple prior studies including 04/26/2019

CLINICAL DATA: Patient returns for follow-up of LEFT breast
abscess. Patient had ultrasound-guided aspiration of LEFT breast
abscess 04/26/2019, with removal of 8 ml of purulent fluid. Culture
showed abundant white cells and abundant gram positive cocci with
moderate Prevotella species beta lactamase negative. She has just
completed a course of cephalexin and feels that the LEFT breast is
slightly improved. She still reports pain in the LEFT breast that
keeps her from sleeping comfortably on her LEFT side.

[Series 1: us breast*right* limited inc axilla · 0.07mm/px · 4 of 4 slices shown]
[im 1/4]
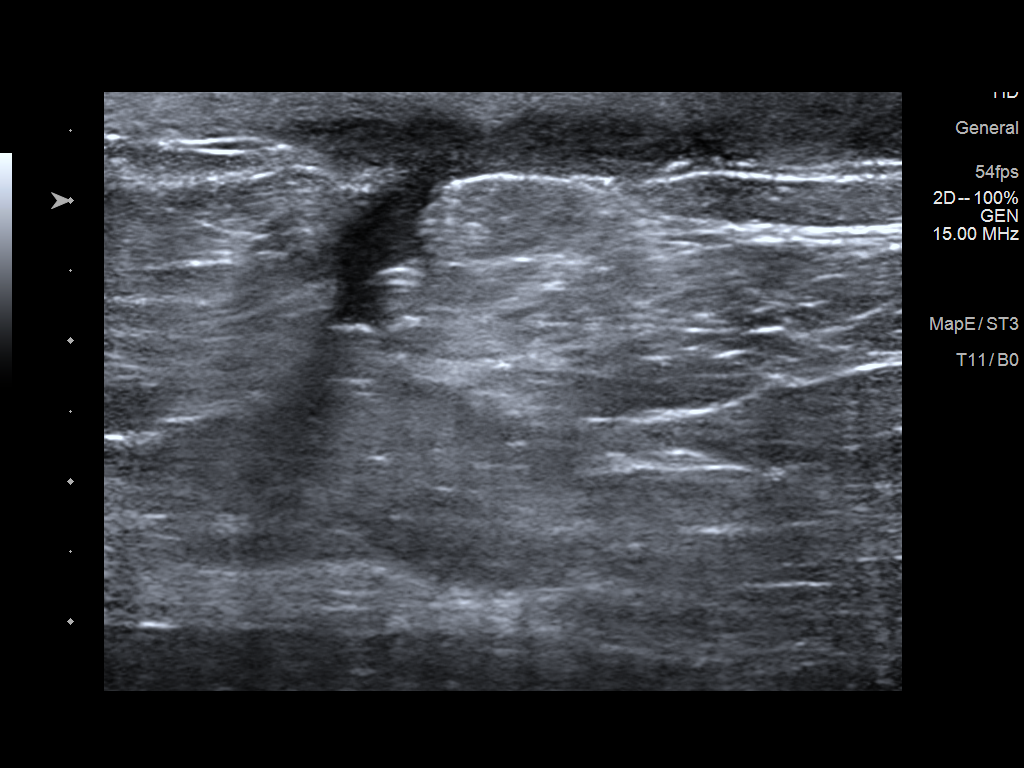
[im 2/4]
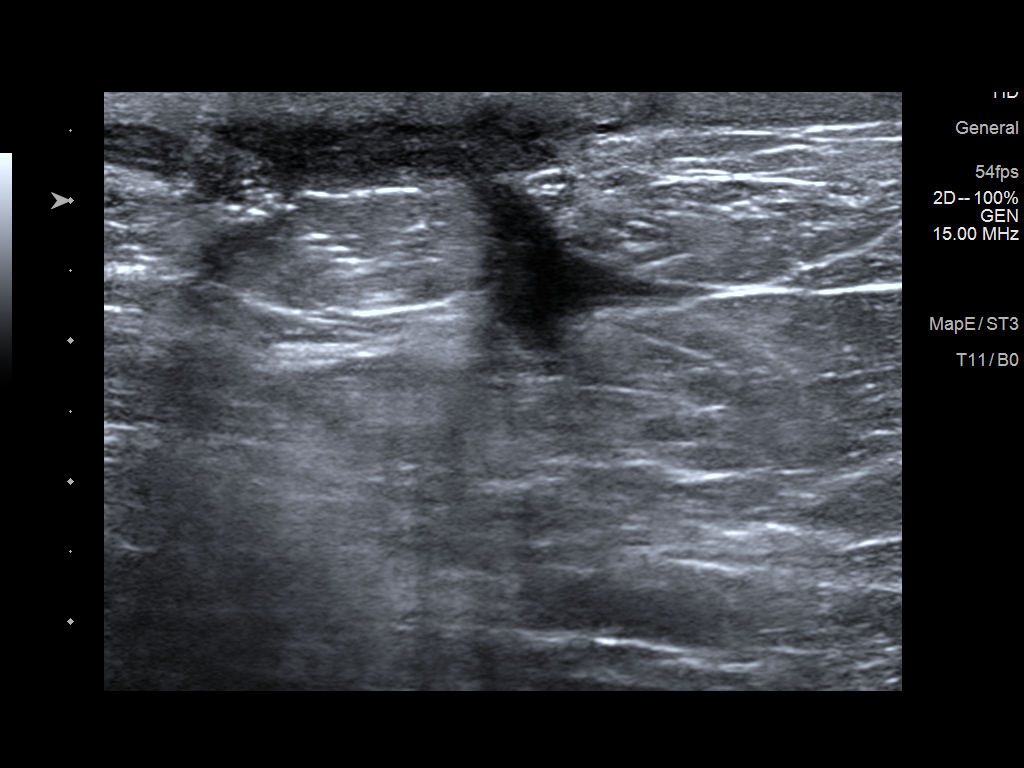
[im 3/4]
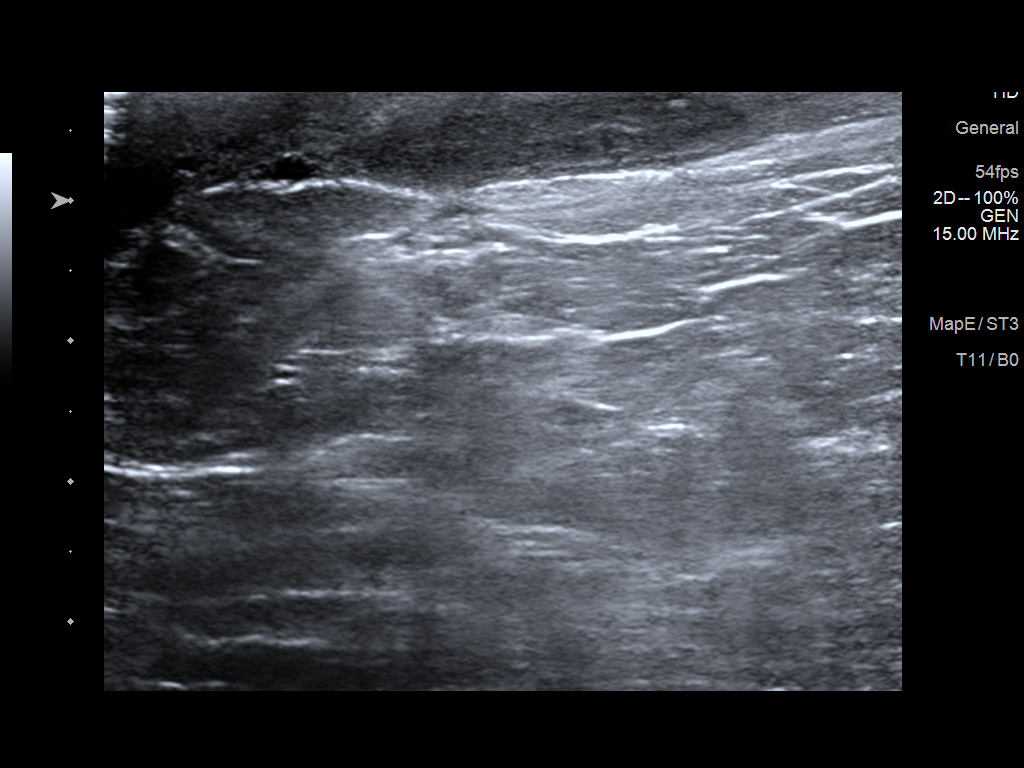
[im 4/4]
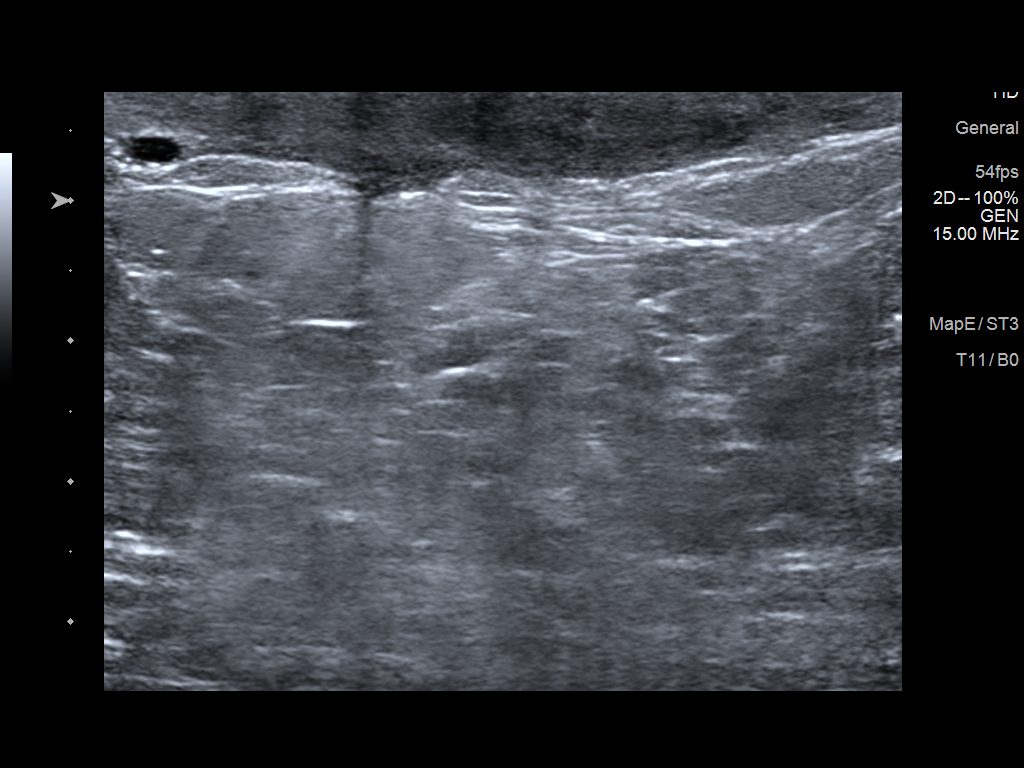

[4 of 4 positions shown; findings below may reference images not displayed]

2 days ago, the patient was dressing and noted spontaneous drainage
of purulent fluid, followed by bloody fluid from the MEDIAL aspect
of the RIGHT breast. History of RIGHT breast abscess and aspiration.

Patient has a history of hidradenitis suppurativa and is followed at
[facility]. Patient has had numerous surgeries and skin grafts for
hidradenitis. Patient takes Humira, methotrexate, and doxycycline
for this condition. The patient's dermatologist does not feel that
the breast abscesses are related to the hidradenitis.

EXAM:
ULTRASOUND OF THE BILATERAL BREAST
FINDINGS: Right breast:

Physical Exam: Patient has a Band-Aid over the 3 o'clock location of
the RIGHT breast, at the site of spontaneous drainage. On removal of
the Band-Aid today, there is persistent trace amount of residual
drainage. I palpate no mass in this region.

Ultrasound: Targeted ultrasound is performed, showing focal skin
thickening in the 3 o'clock location of the RIGHT breast 3
centimeters from the nipple where there was previously a small fluid
collection measuring 1.5 x 0.4 x 2.2 centimeters. No new or
suspicious findings in the 3 o'clock location. In the 4 o'clock
location 2 centimeters from the nipple, there is persistent
irregular hypoechoic breast tissue, consistent with resolving
abscess. No residual fluid collection identified in this region.
There is focal skin thickening in the 4 o'clock location as well.

Left breast:

Physical Exam: There is focal firm mass along the LATERAL aspect of
the LEFT areola, slightly tender on exam.

Ultrasound: Targeted ultrasound is performed, showing an irregular
hypoechoic collection of fluid in the 3 o'clock location of the LEFT
breast 1 centimeter from the nipple correlating well with the
palpable abnormality. This measures 3.5 x 2.1 x 1.4 centimeters.
Peripheral blood flow noted.
IMPRESSION: 1. Spontaneous drainage of residual fluid in the 3 o'clock location
of the RIGHT breast. There is no evidence for recurrent abscess in
this region and no further treatment is felt to be needed at this
time.
2. Recurrent fluid collection in the 3 o'clock location of the LEFT
breast 1 centimeter from the nipple.

RECOMMENDATION:
Recommend aspiration of fluid collection in the 3 o'clock location
of the LEFT breast, performed today and dictated separately.

I have discussed the findings and recommendations with the patient.
If applicable, a reminder letter will be sent to the patient
regarding the next appointment.

BI-RADS CATEGORY  2: Benign.

## 2020-05-11 IMAGING — US US BREAST*L* LIMITED INC AXILLA
1 series · 6 of 6 positions shown · non-contrast
Comparison: Multiple prior studies including 04/26/2019

CLINICAL DATA: Patient returns for follow-up of LEFT breast
abscess. Patient had ultrasound-guided aspiration of LEFT breast
abscess 04/26/2019, with removal of 8 ml of purulent fluid. Culture
showed abundant white cells and abundant gram positive cocci with
moderate Prevotella species beta lactamase negative. She has just
completed a course of cephalexin and feels that the LEFT breast is
slightly improved. She still reports pain in the LEFT breast that
keeps her from sleeping comfortably on her LEFT side.

[Series 1: us breast*left* limited inc axilla · 0.06mm/px · 6 of 6 slices shown]
[im 1/6]
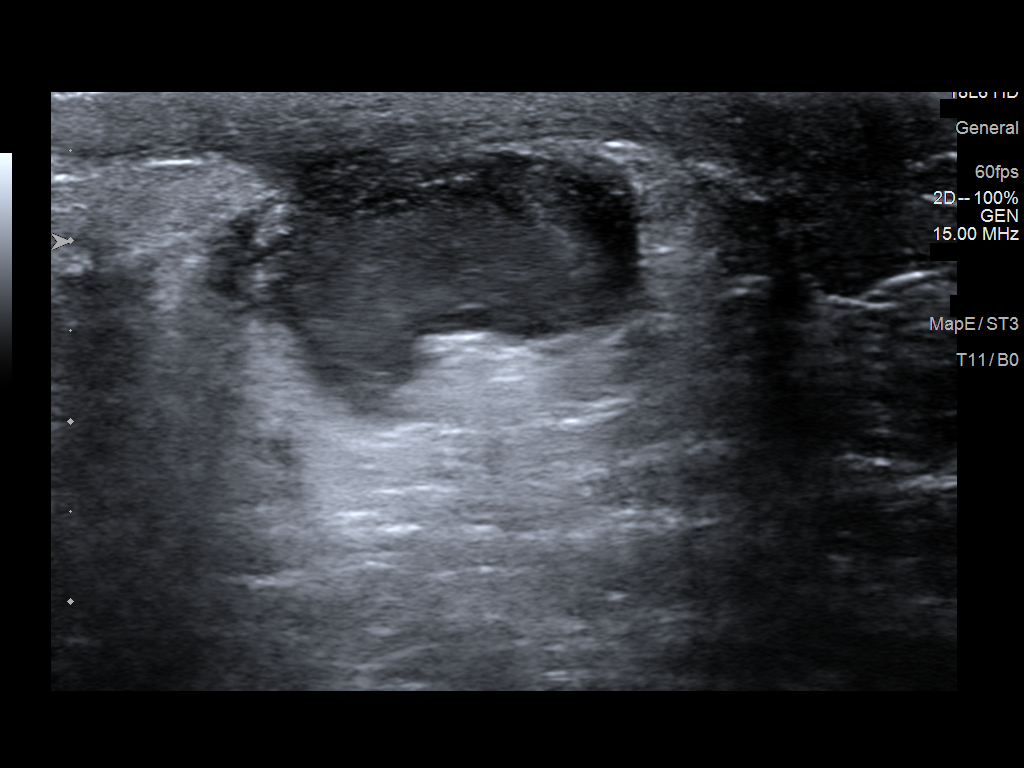
[im 2/6]
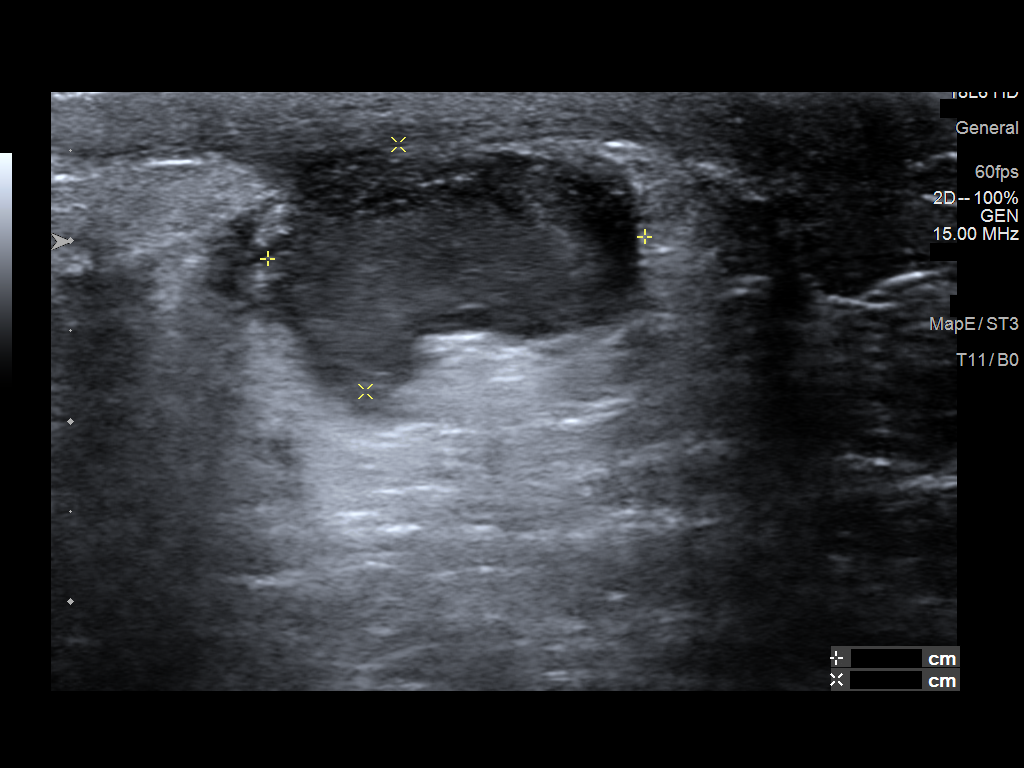
[im 3/6]
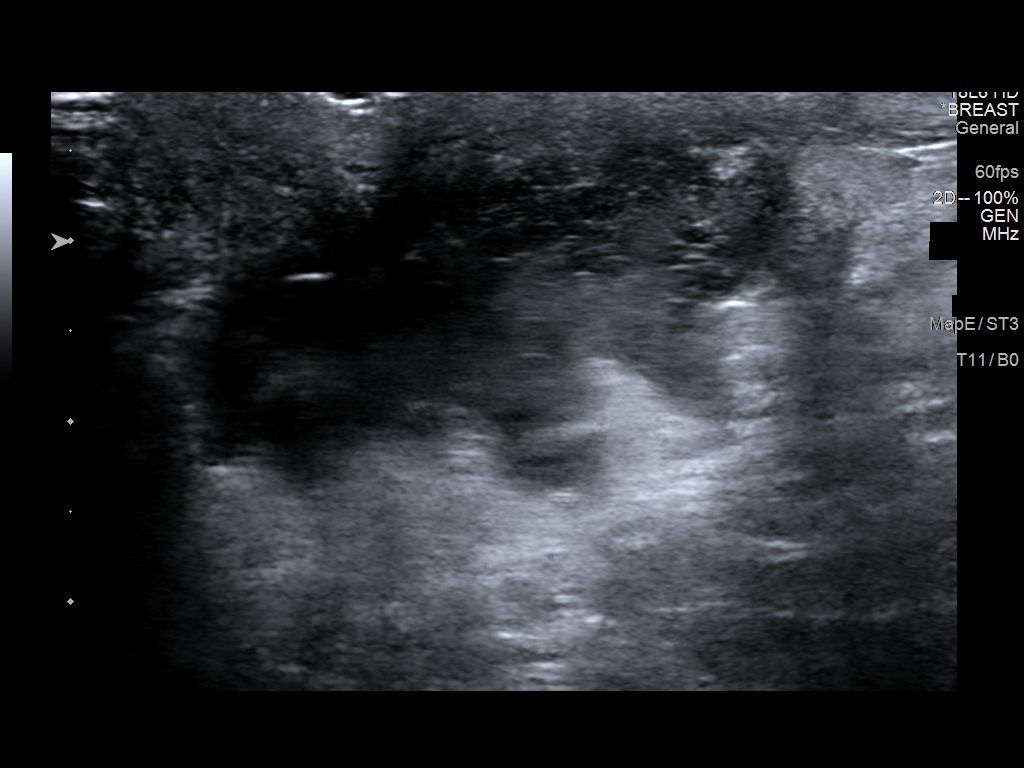
[im 4/6]
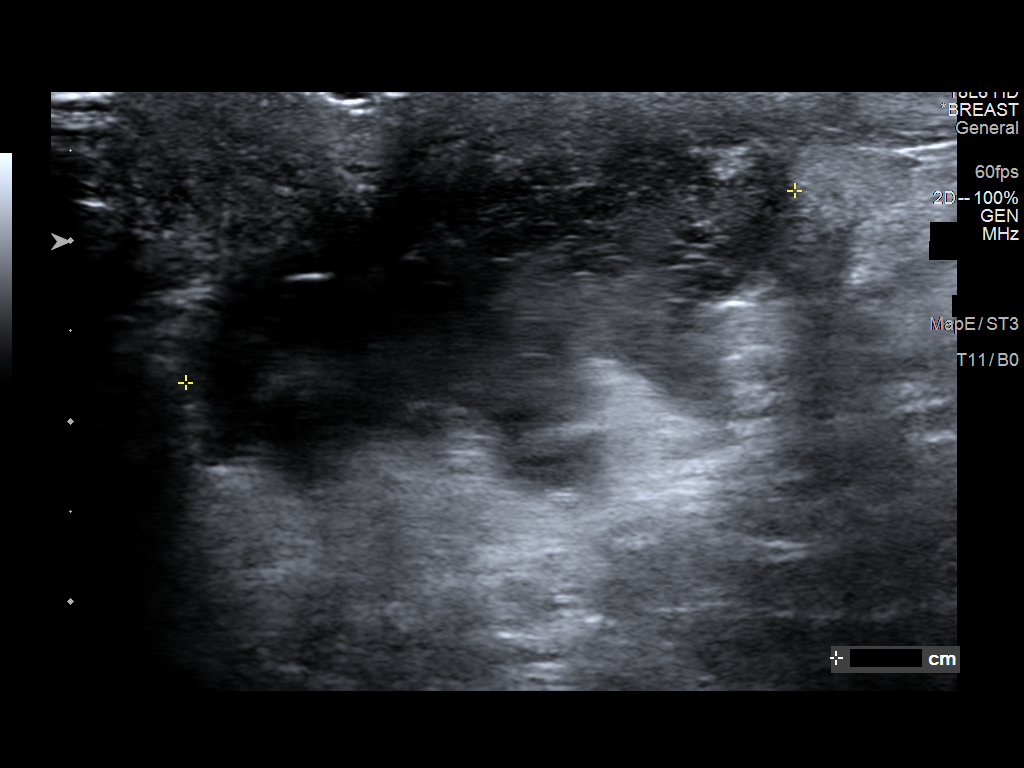
[im 5/6]
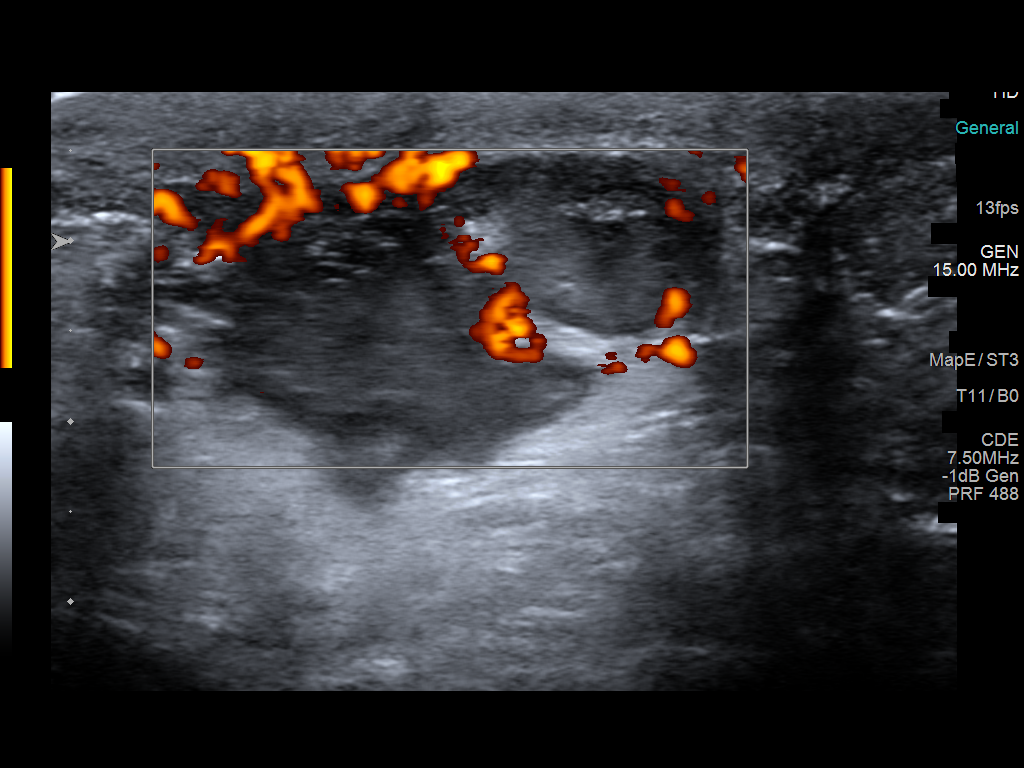
[im 6/6]
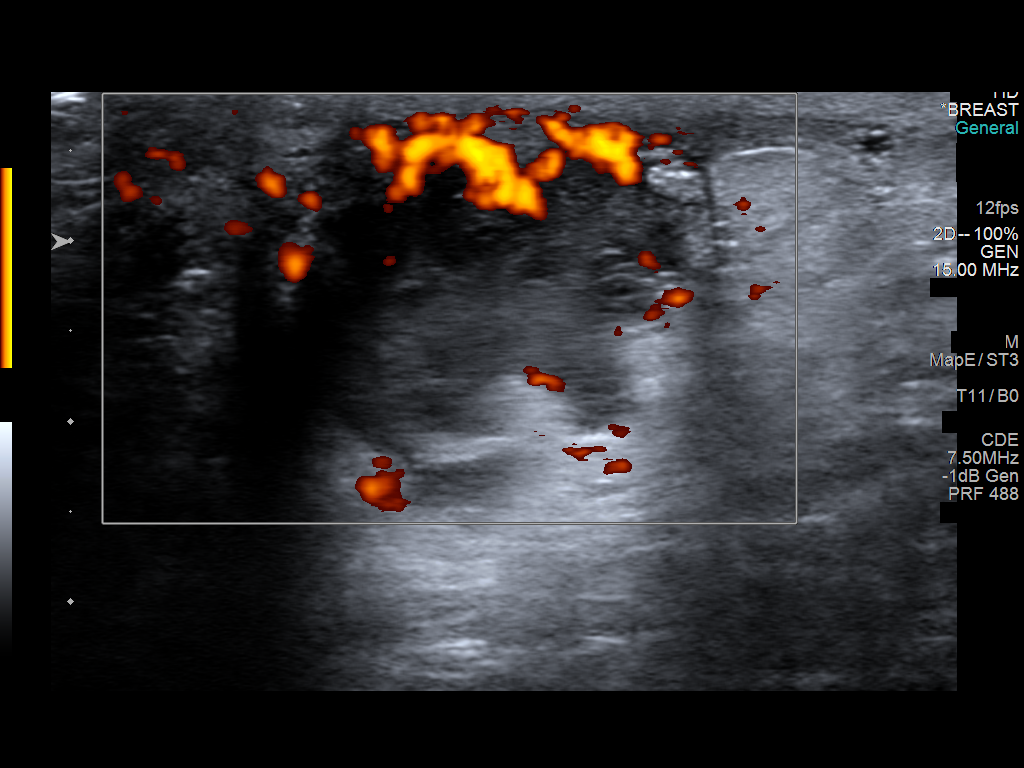

[6 of 6 positions shown; findings below may reference images not displayed]

2 days ago, the patient was dressing and noted spontaneous drainage
of purulent fluid, followed by bloody fluid from the MEDIAL aspect
of the RIGHT breast. History of RIGHT breast abscess and aspiration.

Patient has a history of hidradenitis suppurativa and is followed at
[facility]. Patient has had numerous surgeries and skin grafts for
hidradenitis. Patient takes Humira, methotrexate, and doxycycline
for this condition. The patient's dermatologist does not feel that
the breast abscesses are related to the hidradenitis.

EXAM:
ULTRASOUND OF THE BILATERAL BREAST
FINDINGS: Right breast:

Physical Exam: Patient has a Band-Aid over the 3 o'clock location of
the RIGHT breast, at the site of spontaneous drainage. On removal of
the Band-Aid today, there is persistent trace amount of residual
drainage. I palpate no mass in this region.

Ultrasound: Targeted ultrasound is performed, showing focal skin
thickening in the 3 o'clock location of the RIGHT breast 3
centimeters from the nipple where there was previously a small fluid
collection measuring 1.5 x 0.4 x 2.2 centimeters. No new or
suspicious findings in the 3 o'clock location. In the 4 o'clock
location 2 centimeters from the nipple, there is persistent
irregular hypoechoic breast tissue, consistent with resolving
abscess. No residual fluid collection identified in this region.
There is focal skin thickening in the 4 o'clock location as well.

Left breast:

Physical Exam: There is focal firm mass along the LATERAL aspect of
the LEFT areola, slightly tender on exam.

Ultrasound: Targeted ultrasound is performed, showing an irregular
hypoechoic collection of fluid in the 3 o'clock location of the LEFT
breast 1 centimeter from the nipple correlating well with the
palpable abnormality. This measures 3.5 x 2.1 x 1.4 centimeters.
Peripheral blood flow noted.
IMPRESSION: 1. Spontaneous drainage of residual fluid in the 3 o'clock location
of the RIGHT breast. There is no evidence for recurrent abscess in
this region and no further treatment is felt to be needed at this
time.
2. Recurrent fluid collection in the 3 o'clock location of the LEFT
breast 1 centimeter from the nipple.

RECOMMENDATION:
Recommend aspiration of fluid collection in the 3 o'clock location
of the LEFT breast, performed today and dictated separately.

I have discussed the findings and recommendations with the patient.
If applicable, a reminder letter will be sent to the patient
regarding the next appointment.

BI-RADS CATEGORY  2: Benign.

## 2020-05-18 IMAGING — US US BREAST*L* LIMITED INC AXILLA
1 series · 5 of 5 positions shown · non-contrast
Comparison: [DATE] [DATE], [DATE], [DATE] [DATE], [DATE], [DATE] [DATE],
[DATE], [DATE] [DATE], [DATE]

CLINICAL DATA: 47-year-old patient returns for 1 week follow-up
after her second left breast aspiration, performed on May 03, 2019. At that time, 6 cc of nonpurulent dark red fluid was
aspirated. Prior to that, on April 26, 2019 ultrasound-guided
aspiration was performed and sent for culture. The patient has
completed her antibiotic therapy.

Currently, the patient states that her symptoms have markedly
improved. She does have some persistent palpable induration in the 3
o'clock retroareolar left breast, but the pain has significantly
improved and her skin color is back to normal.
EXAM:
ULTRASOUND OF THE LEFT BREAST

[Series 1: us breast*left* limited inc axilla · 0.07mm/px · 5 of 5 slices shown]
[im 1/5]
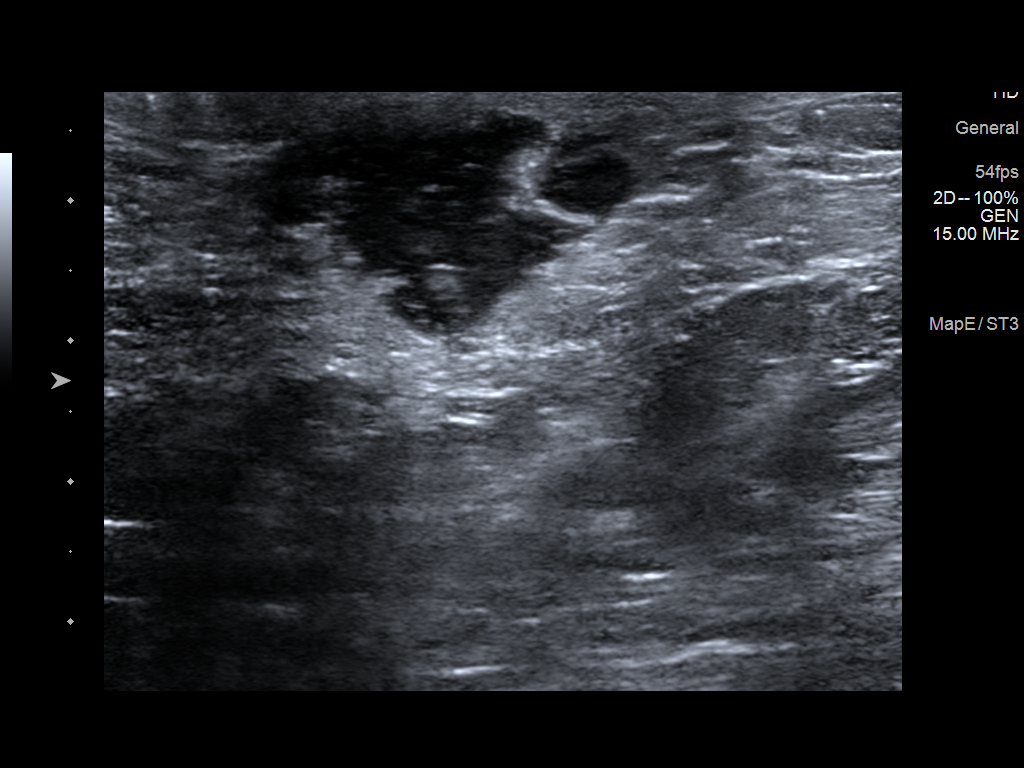
[im 2/5]
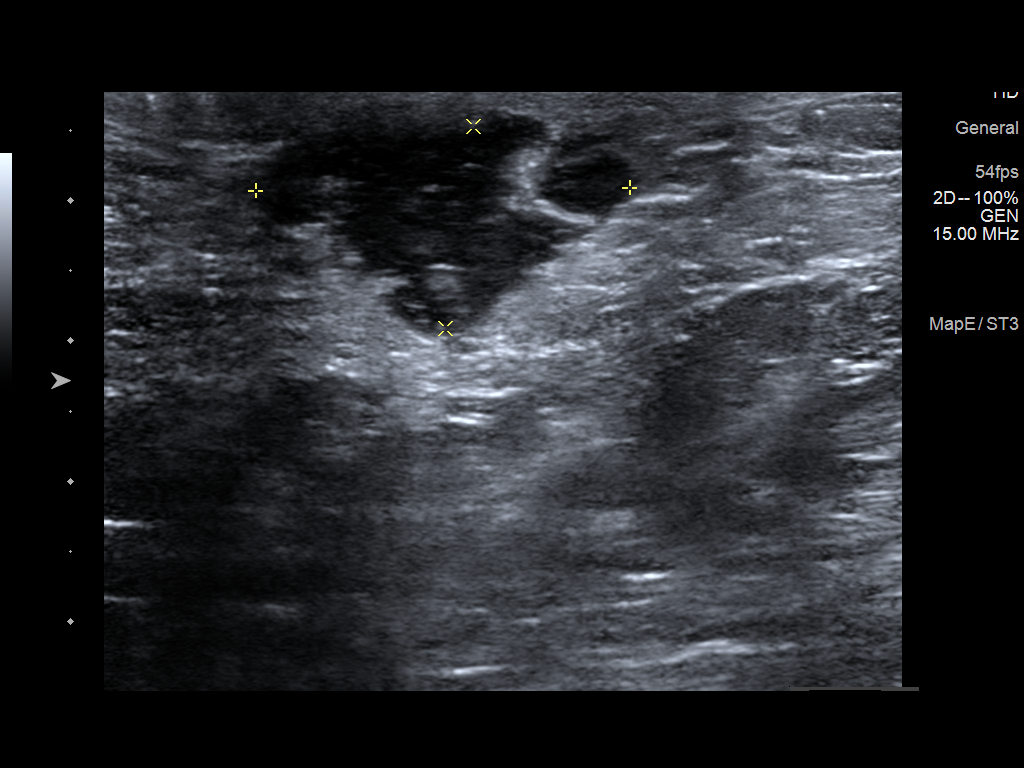
[im 3/5]
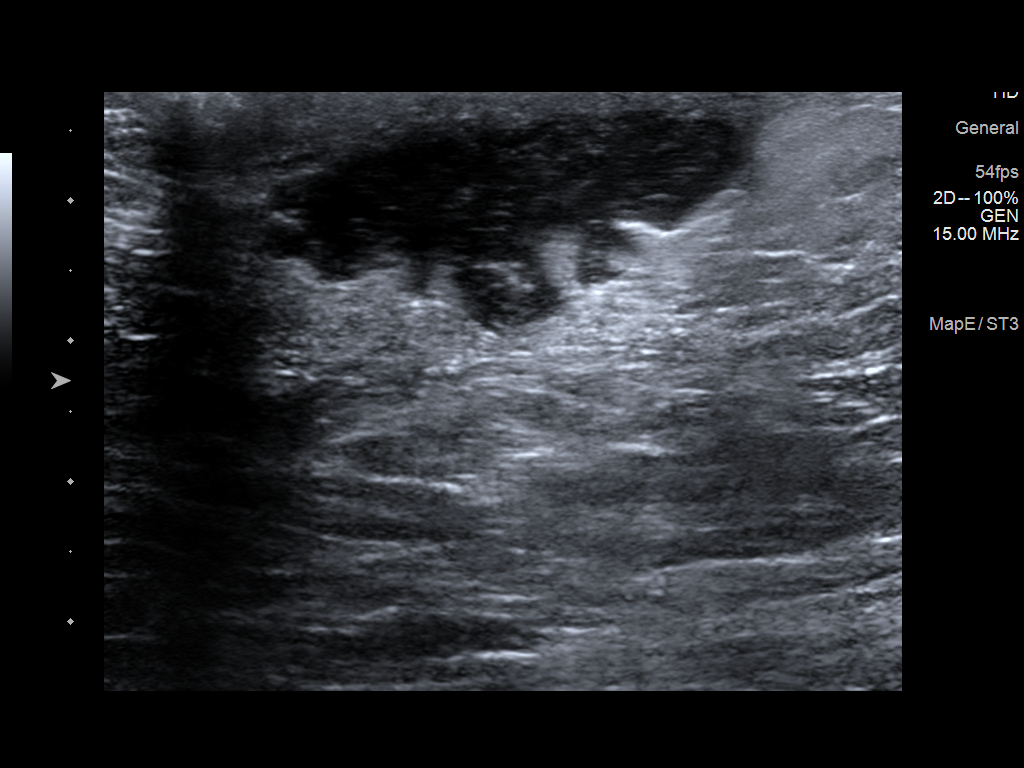
[im 4/5]
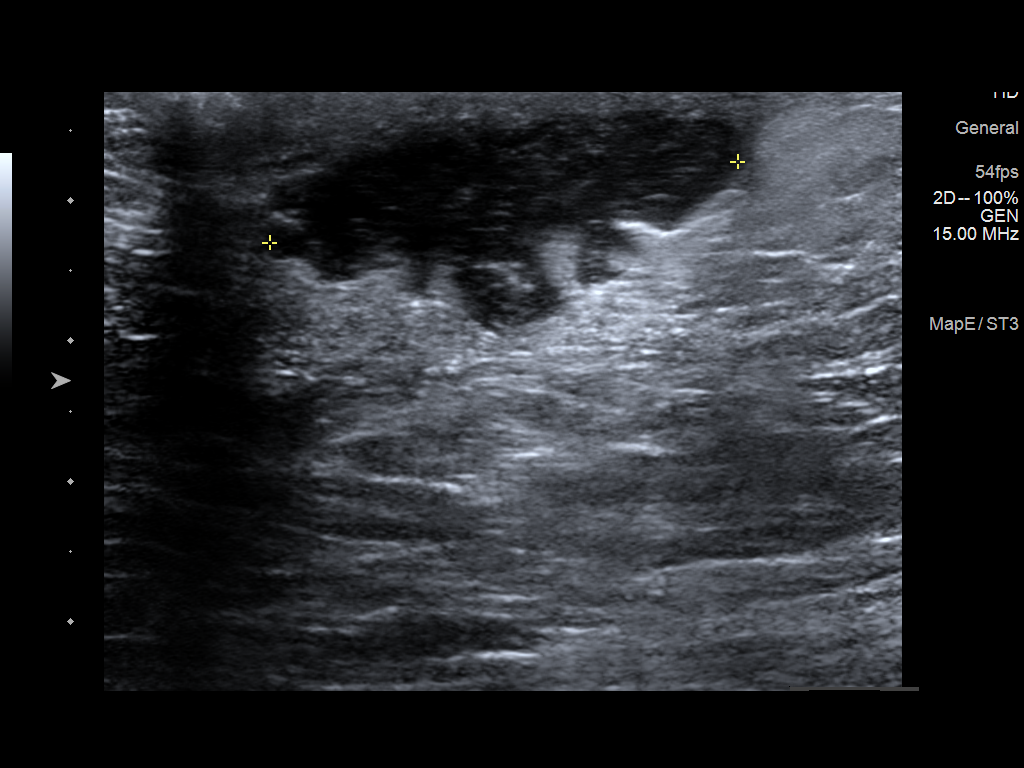
[im 5/5]
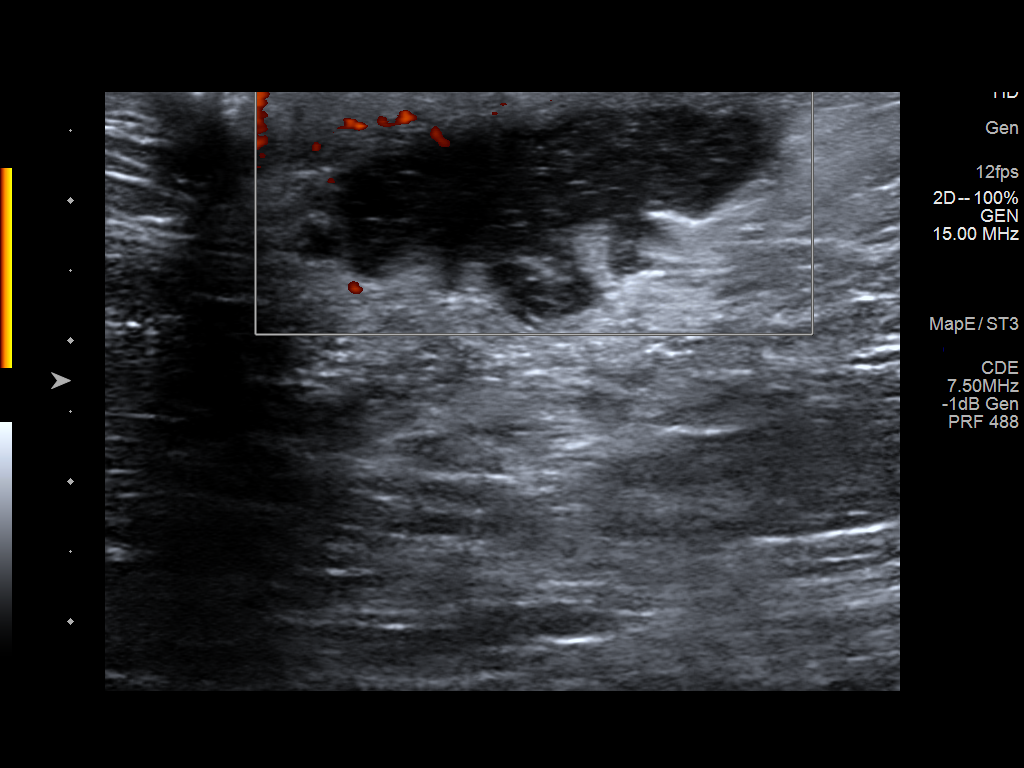

[5 of 5 positions shown; findings below may reference images not displayed]

FINDINGS: On physical exam, there is palpable area of thickening in the 3
o'clock left breast approximately 1 cm from the nipple. The skin of
the left nipple areolar complex is normal in color. No erythema or
visible skin thickening.

Targeted ultrasound is performed, showing a subdermal fluid
collection at 3 o'clock position 1 cm from the nipple measuring
x 2.7 x 1.5 cm. The fluid collection appears less complex on today's
ultrasound compared to priors, and the overlying skin thickness
appears to be decreasing over time.

On color Doppler imaging, the flow blood flow around the periphery
of the fluid collection has significantly decreased since May 03, 2019, suggesting interval improvement in inflammation.
IMPRESSION: Significant clinical improvement in the left breast following a
second aspiration and antibiotic therapy. There is a minimally
complex fluid collection in the 3 o'clock left breast 1 cm from the
nipple. Given patient's improved symptoms, and the decreased
hyperemia and decrease in skin thickening, this fluid collection is
unlikely to be infected.

RECOMMENDATION:
Ultrasound follow-up in 4 weeks is suggested to evaluate the
patient's left breast in the 3 o'clock position.

I have discussed the findings and recommendations with the patient.
If applicable, a reminder letter will be sent to the patient
regarding the next appointment.

BI-RADS CATEGORY  2: Benign.

## 2020-05-23 ENCOUNTER — Other Ambulatory Visit: Payer: BC Managed Care – PPO

## 2020-06-03 ENCOUNTER — Ambulatory Visit
Admission: RE | Admit: 2020-06-03 | Discharge: 2020-06-03 | Disposition: A | Discharge status: Home / Self Care | Payer: BC Managed Care – PPO | Source: Ambulatory Visit | Attending: Family Medicine | Admitting: Family Medicine

## 2020-06-03 ENCOUNTER — Other Ambulatory Visit: Payer: Self-pay | Admitting: Family Medicine

## 2020-06-03 ENCOUNTER — Other Ambulatory Visit: Payer: Self-pay

## 2020-06-03 DIAGNOSIS — N6310 Unspecified lump in the right breast, unspecified quadrant: Secondary | ICD-10-CM

## 2020-10-14 ENCOUNTER — Other Ambulatory Visit: Payer: Self-pay | Admitting: Student

## 2020-10-14 DIAGNOSIS — N611 Abscess of the breast and nipple: Secondary | ICD-10-CM

## 2020-10-15 ENCOUNTER — Other Ambulatory Visit: Payer: BC Managed Care – PPO

## 2020-10-21 ENCOUNTER — Ambulatory Visit
Admission: RE | Admit: 2020-10-21 | Discharge: 2020-10-21 | Disposition: A | Discharge status: Home / Self Care | Payer: BC Managed Care – PPO | Source: Ambulatory Visit | Attending: Student | Admitting: Student

## 2020-10-21 ENCOUNTER — Ambulatory Visit: Payer: Self-pay

## 2020-10-21 ENCOUNTER — Other Ambulatory Visit: Payer: Self-pay

## 2020-10-21 DIAGNOSIS — N611 Abscess of the breast and nipple: Secondary | ICD-10-CM

## 2021-01-01 ENCOUNTER — Encounter: Payer: Self-pay | Admitting: *Deleted

## 2021-01-02 ENCOUNTER — Emergency Department (HOSPITAL_COMMUNITY)
Admission: EM | Admit: 2021-01-02 | Discharge: 2021-01-03 | Discharge status: Against Medical Advice | Payer: BC Managed Care – PPO

## 2021-01-02 ENCOUNTER — Encounter (HOSPITAL_COMMUNITY): Payer: Self-pay | Admitting: *Deleted

## 2021-01-02 ENCOUNTER — Ambulatory Visit (HOSPITAL_COMMUNITY)
Admission: EM | Admit: 2021-01-02 | Discharge: 2021-01-02 | Disposition: A | Discharge status: Home / Self Care | Payer: BC Managed Care – PPO | Attending: Family Medicine | Admitting: Family Medicine

## 2021-01-02 ENCOUNTER — Other Ambulatory Visit: Payer: Self-pay

## 2021-01-02 DIAGNOSIS — Z86718 Personal history of other venous thrombosis and embolism: Secondary | ICD-10-CM

## 2021-01-02 DIAGNOSIS — M79601 Pain in right arm: Secondary | ICD-10-CM | POA: Diagnosis not present

## 2021-01-02 MED ORDER — CYCLOBENZAPRINE HCL 5 MG PO TABS: 5.0000 mg | ORAL_TABLET | Freq: Three times a day (TID) | ORAL | 0 refills | Status: AC | PRN

## 2021-01-02 NOTE — ED Provider Notes (Signed)
[facility]    CSN: [id number] Arrival date & time: 01/02/21  1451      History   Chief Complaint Chief Complaint  Patient presents with  . Arm Pain    HPI Cheryl Bentley is a 50 y.o. female.   Patient presenting today with 1 day history of acute onset severe right shoulder and upper arm pain that started last night.  She denies any provoking movements or injuries prior to onset.  She denies any swelling, discoloration, decreased range of motion though motion does seem to make it worse.  Does have some minimal numbness at times into the hand.  So far trying over-the-counter pain relievers with absolutely no benefit.  Of note, patient with history of bilateral DVT and PE about 4 years ago, no longer on anticoagulation.  These were thought to be triggered by hormone therapy that she was on at the time which she has since DC'd.  No past history of orthopedic issues, including in her neck or the shoulder.     Past Medical History:  Diagnosis Date  . Depression   . DVT (deep venous thrombosis) (HCC)   . Hidradenitis suppurativa   . Mastitis   . Morbid obesity due to excess calories [facility])     Patient Active Problem List   Diagnosis Date Noted  . Deep venous thrombosis (DVT) of both peroneal veins (HCC) 06/02/2015  . DVT, bilateral lower limbs (HCC) 06/02/2015  . Pulmonary embolus (HCC) 06/02/2015    Past Surgical History:  Procedure Laterality Date  . AXILLARY HIDRADENITIS EXCISION    . INGUINAL HIDRADENITIS EXCISION     multiple surgeries in different areas of the body.     OB History   No obstetric history on file.      Home Medications    Prior to Admission medications   Medication Sig Start Date End Date Taking? Authorizing Provider  cyclobenzaprine (FLEXERIL) 5 MG tablet Take 1 tablet (5 mg total) by mouth 3 (three) times daily as needed for muscle spasms. Do not drink alcohol or drive while taking this medication.  May cause drowsiness 01/02/21   Yes Particia Nearing, PA-C  Adalimumab (HUMIRA Ramsey) Inject 40 mg into the skin once a week.     [provider]  doxycycline (DORYX) 100 MG DR capsule Take 100 mg by mouth 2 (two) times daily.    [provider]  folic acid (FOLVITE) 1 MG tablet Take 1 mg by mouth daily.    [provider]  ibuprofen (ADVIL,MOTRIN) 200 MG tablet Take 400 mg by mouth every 6 (six) hours as needed for mild pain.    [provider]  methotrexate 2.5 MG tablet Take 10 mg by mouth once a week. Wednesday    [provider]  misoprostol (CYTOTEC) 200 MCG tablet Take one by mouth and place one in vagina 6 hours before appointment 07/01/15   Jacklyn Shell, CNM  silver sulfADIAZINE (SILVADENE) 1 % cream  11/06/14   [provider]  spironolactone (ALDACTONE) 50 MG tablet Take 150 mg by mouth daily.     [provider]    Family History Family History  Problem Relation Age of Onset  . Diabetes Mother   . Kidney disease Mother   . Atrial fibrillation Father   . Cancer Father        skin   . Diabetes Brother   . Cancer Paternal Aunt   . Cancer Paternal Uncle  Social History Social History   Tobacco Use  . Smoking status: Never Smoker  . Smokeless tobacco: Never Used  Substance Use Topics  . Alcohol use: No  . Drug use: No     Allergies   Amoxicillin and Penicillins   Review of Systems Review of Systems Per HPI Physical Exam Triage Vital Signs ED Triage Vitals  Enc Vitals Group     BP 01/02/21 1521 (!) 137/96     Pulse Rate 01/02/21 1521 80     Resp 01/02/21 1521 18     Temp 01/02/21 1521 98.7 F (37.1 C)     Temp src --      SpO2 01/02/21 1521 98 %     Weight --      Height --      Head Circumference --      Peak Flow --      Pain Score 01/02/21 1518 6     Pain Loc --      Pain Edu? --      Excl. in GC? --    No data found.  Updated Vital Signs BP (!) 137/96   Pulse 80   Temp 98.7 F (37.1 C)    Resp 18   SpO2 98%   Visual Acuity Right Eye Distance:   Left Eye Distance:   Bilateral Distance:    Right Eye Near:   Left Eye Near:    Bilateral Near:     Physical Exam Vitals and nursing note reviewed.  Constitutional:      Appearance: Normal appearance. She is not ill-appearing.  HENT:     Head: Atraumatic.  Eyes:     Extraocular Movements: Extraocular movements intact.     Conjunctiva/sclera: Conjunctivae normal.  Cardiovascular:     Rate and Rhythm: Normal rate and regular rhythm.     Pulses: Normal pulses.     Heart sounds: Normal heart sounds.  Pulmonary:     Effort: Pulmonary effort is normal. No respiratory distress.     Breath sounds: Normal breath sounds. No wheezing or rales.  Musculoskeletal:        General: Tenderness present. No swelling, deformity or signs of injury. Normal range of motion.     Cervical back: Normal range of motion and neck supple.     Comments: Mildly tender to palpation right lateral latissimus but otherwise no significant tender areas.  Visual fields are full and intact.  Grip strength full and equal bilateral hands.  No evident edema, asymmetry  Skin:    General: Skin is warm and dry.     Findings: No bruising or erythema.  Neurological:     Mental Status: She is alert and oriented to person, place, and time.     Sensory: No sensory deficit.     Motor: No weakness.     Gait: Gait normal.  Psychiatric:        Mood and Affect: Mood normal.        Thought Content: Thought content normal.        Judgment: Judgment normal.      UC Treatments / Results  Labs (all labs ordered are listed, but only abnormal results are displayed) Labs Reviewed - No data to display  EKG   Radiology No results found.  Procedures Procedures (including critical care time)  Medications Ordered in UC Medications - No data to display  Initial Impression / Assessment and Plan / UC Course  I have reviewed the triage vital signs and  the nursing  notes.  Pertinent labs & imaging results that were available during my care of the patient were reviewed by me and considered in my medical decision making (see chart for details).     Possibly radicular in nature, but given her history of DVT and unprovoked nature of her severe pain, this is cause for concern for another DVT.  Vascular imaging at [facility] called, unfortunately they were unable to fit her in for a DVT rule out ultrasound prior to 5 PM today and said that the earliest available would be Monday morning.  She declines going to the ED at this time and wishes to call the imaging department first thing Monday morning to schedule her ultrasound.  Order placed for a right upper extremity DVT ultrasound.  We will treat for impingement, radiculopathy in the meantime with Flexeril, naproxen, heat, rest.  She knows to go to the ED if her symptoms worsen at any time.  She is agreeable to this plan.  Final Clinical Impressions(s) / UC Diagnoses   Final diagnoses:  Right arm pain  History of DVT (deep vein thrombosis)   Discharge Instructions   None    ED Prescriptions    Medication Sig Dispense Auth. Provider   cyclobenzaprine (FLEXERIL) 5 MG tablet Take 1 tablet (5 mg total) by mouth 3 (three) times daily as needed for muscle spasms. Do not drink alcohol or drive while taking this medication.  May cause drowsiness 15 tablet Particia Nearing, [place]     PDMP not reviewed this encounter.   Particia Nearing, [place] 01/02/21 2017

## 2021-01-02 NOTE — ED Triage Notes (Signed)
Pt reports Rt arm pain with out injury. Pt has tried OTC with out relief.

## 2021-01-06 ENCOUNTER — Encounter: Payer: BC Managed Care – PPO | Admitting: Obstetrics & Gynecology

## 2021-01-06 ENCOUNTER — Other Ambulatory Visit: Payer: Self-pay | Admitting: Family Medicine

## 2021-01-21 ENCOUNTER — Other Ambulatory Visit: Payer: Self-pay | Admitting: Family Medicine

## 2021-02-04 ENCOUNTER — Other Ambulatory Visit (HOSPITAL_COMMUNITY)
Admission: RE | Admit: 2021-02-04 | Discharge: 2021-02-04 | Disposition: A | Discharge status: Home / Self Care | Payer: BC Managed Care – PPO | Source: Ambulatory Visit | Attending: Adult Health | Admitting: Adult Health

## 2021-02-04 ENCOUNTER — Encounter: Payer: Self-pay | Admitting: Obstetrics & Gynecology

## 2021-02-04 ENCOUNTER — Ambulatory Visit (INDEPENDENT_AMBULATORY_CARE_PROVIDER_SITE_OTHER): Payer: BC Managed Care – PPO | Admitting: Obstetrics & Gynecology

## 2021-02-04 ENCOUNTER — Other Ambulatory Visit: Payer: Self-pay

## 2021-02-04 VITALS — BP 134/79 | HR 82 | Ht 73.0 in | Wt 342.4 lb

## 2021-02-04 DIAGNOSIS — Z01419 Encounter for gynecological examination (general) (routine) without abnormal findings: Secondary | ICD-10-CM

## 2021-02-04 NOTE — Progress Notes (Signed)
   WELL WOMAN EXAMINATION Patient name: Cheryl Bentley MRN [id number]  Date of birth: 1970/09/12 Chief Complaint:   Gynecologic Exam  History of Present Illness:   Cheryl Bentley is a 50 y.o. postmenopausal female being seen today for a routine well-woman exam.   Menses stopped about a year ago- denies any bleeding since then. Patient's last menstrual period was 04/02/2019 (exact date).  She does note occasional hot flashes/night sweats, but tolerable.  Rates her symptoms 5/10.  She is aware that due to h/o DVT she is not a candidate for HRT   Last pap never completed.  Last mammogram: 05/2020- ordered for this year. Colonoscopy: not completed- followed by PCP  Depression screen [facility] 2/9 02/04/2021  Decreased Interest 0  Down, Depressed, Hopeless 1  PHQ - 2 Score 1  Altered sleeping 1  Tired, decreased energy 1  Change in appetite 1  Feeling bad or failure about yourself  1  Trouble concentrating 0  Moving slowly or fidgety/restless 0  Suicidal thoughts 0  PHQ-9 Score 5    Review of Systems:   Pertinent items are noted in HPI Denies any headaches, blurred vision, fatigue, shortness of breath, chest pain, abdominal pain, bowel movements, urination, or intercourse unless otherwise stated above.  Pertinent History Reviewed:  Reviewed past medical,surgical, social and family history.  Reviewed problem list, medications and allergies. Physical Assessment:   Vitals:   02/04/21 0840  BP: 134/79  Pulse: 82  Weight: (!) 342 lb 6.4 oz (155.3 kg)  Height: 6\' 1"  (1.854 m)  Body mass index is 45.17 kg/m.        Physical Examination:   General appearance - well appearing, and in no distress  Mental status - alert, oriented to person, place, and time  Psych:  She has a normal mood and affect  Skin - warm and dry, normal color, no suspicious lesions noted  Chest - effort normal, all lung fields clear to auscultation bilaterally  Heart - normal rate and regular  rhythm  Neck:  midline trachea, no thyromegaly or nodules  Breasts - breasts appear normal, no suspicious masses, no skin or nipple changes or  axillary nodes, HS scarring noted  Abdomen - soft, nontender, nondistended, no masses or organomegaly  Pelvic - VULVA: HS scarring noted, no active abscess/lesions noted  VAGINA: normal appearing vagina with normal color and discharge, no lesions  CERVIX: normal appearing cervix without discharge or lesions, no CMT  Thin prep pap is done with HR HPV cotesting  UTERUS: uterus is felt to be normal size, shape, consistency and nontender ADNEXA: No adnexal masses or tenderness noted.  Bimanual exam limited due to body habitus  Extremities:  No swelling or varicosities noted  Chaperone:     Assessment & Plan:  1) Well woman exam -pap completed, reviewed guidelines  2) Vasomotor symptoms -reviewed conservative management   Meds: No orders of the defined types were placed in this encounter.   Follow-up: Return in about 1 year (around 02/04/2022) for Annual.   02/06/2022, DO Attending Obstetrician & Gynecologist, Faculty Practice Center for [facility], [facility]

## 2021-02-05 LAB — CYTOLOGY - PAP
Comment: NEGATIVE
Diagnosis: NEGATIVE
High risk HPV: NEGATIVE

## 2021-02-17 ENCOUNTER — Encounter: Payer: Self-pay | Admitting: Neurology

## 2021-03-24 ENCOUNTER — Encounter: Payer: BC Managed Care – PPO | Admitting: Neurology

## 2021-04-22 ENCOUNTER — Other Ambulatory Visit: Payer: Self-pay

## 2021-04-22 ENCOUNTER — Ambulatory Visit: Payer: BC Managed Care – PPO | Admitting: Neurology

## 2021-04-22 DIAGNOSIS — R202 Paresthesia of skin: Secondary | ICD-10-CM

## 2021-04-22 NOTE — Procedures (Addendum)
[facility]  [address] [place], [address]  [place], [place] [address] Tel: [phone] Fax:  [phone] Test Date:  04/22/2021  Patient: Cheryl Bentley DOB: May 12, 1971 Physician: Nita Sickle, DO  Sex: Female Height: 6\' 1"  Ref Phys: , M.D.  ID#: Margarita Rana   Technician:    Patient Complaints: This is a 50 year old female referred for evaluation of right finger numbness.  NCV & EMG Findings: Extensive electrodiagnostic testing of the right upper extremity shows: Right median, ulnar, and mixed palmar sensory responses are within normal limits. Right median and ulnar motor responses are within normal limits. There is no evidence of active or chronic motor axonal loss changes affecting any of the tested muscles.  Motor unit configuration and recruitment pattern is within normal limits.  Impression: This is a normal study of the right upper extremity.  In particular, there is no evidence of carpal tunnel syndrome or cervical radiculopathy.      ___________________________ 59, DO    Nerve Conduction Studies Anti Sensory Summary Table   Stim Site NR Peak (ms) Norm Peak (ms) P-T Amp (V) Norm P-T Amp  Right Median Anti Sensory (2nd Digit)  33C  Wrist    2.9 <3.4 41.0 >20  Right Ulnar Anti Sensory (5th Digit)  33C  Wrist    2.4 <3.1 50.4 >12   Motor Summary Table   Stim Site NR Onset (ms) Norm Onset (ms) O-P Amp (mV) Norm O-P Amp Site1 Site2 Delta-0 (ms) Dist (cm) Vel (m/s) Norm Vel (m/s)  Right Median Motor (Abd Poll Brev)  33C  Wrist    3.0 <3.9 6.6 >6 Elbow Wrist 4.6 28.0 61 >50  Elbow    7.6  6.3  Ulnar-wrist crossover Elbow 4.5 0.0    Ulnar-wrist crossover    3.1  2.2         Right Ulnar Motor (Abd Dig Minimi)  33C  Wrist    1.6 <3.1 8.2 >7 B Elbow Wrist 4.2 26.0 62 >50  B Elbow    5.8  8.0  A Elbow B Elbow 1.6 10.0 62 >50  A Elbow    7.4  7.6          Comparison Summary Table   Stim Site NR Peak (ms) Norm Peak (ms) P-T Amp (V)  Site1 Site2 Delta-P (ms) Norm Delta (ms)  Right Median/Ulnar Palm Comparison (Wrist - 8cm)  33C  Median Palm    1.7 <2.2 59.0 Median Palm Ulnar Palm 0.2   Ulnar Palm    1.5 <2.2 10.9       EMG   Side Muscle Ins Act Fibs Psw Fasc Number Recrt Dur Dur. Amp Amp. Poly Poly. Comment  Right 1stDorInt Nml Nml Nml Nml Nml Nml Nml Nml Nml Nml Nml Nml N/A  Right PronatorTeres Nml Nml Nml Nml Nml Nml Nml Nml Nml Nml Nml Nml N/A  Right Biceps Nml Nml Nml Nml Nml Nml Nml Nml Nml Nml Nml Nml N/A  Right Triceps Nml Nml Nml Nml Nml Nml Nml Nml Nml Nml Nml Nml N/A  Right Deltoid Nml Nml Nml Nml Nml Nml Nml Nml Nml Nml Nml Nml N/A      Waveforms:

## 2021-05-12 ENCOUNTER — Other Ambulatory Visit: Payer: Self-pay

## 2021-05-12 ENCOUNTER — Ambulatory Visit: Payer: BC Managed Care – PPO | Admitting: Gastroenterology

## 2021-05-12 ENCOUNTER — Encounter: Payer: Self-pay | Admitting: Gastroenterology

## 2021-05-12 DIAGNOSIS — Z1211 Encounter for screening for malignant neoplasm of colon: Secondary | ICD-10-CM | POA: Diagnosis not present

## 2021-05-12 MED ORDER — NA SULFATE-K SULFATE-MG SULF 17.5-3.13-1.6 GM/177ML PO SOLN: 1.0000 | ORAL | 0 refills | Status: DC

## 2021-05-12 NOTE — Patient Instructions (Signed)
We are arranging a colonoscopy in the near future!  Further recommendations to follow!  It was a pleasure to see you today. I want to create trusting relationships with patients to provide genuine, compassionate, and quality care. I value your feedback. If you receive a survey regarding your visit,  I greatly appreciate you taking time to fill this out.   David Towson W. Marylyn Appenzeller, PhD, ANP-BC [facility]   

## 2021-05-12 NOTE — Progress Notes (Signed)
Primary Care Physician:  Gareth Morgan, MD Referring Physician: Dillard Essex, NP/Dr. Sudie Bailey Primary Gastroenterologist:  Dr. Marletta Lor  Chief Complaint  Patient presents with   Colonoscopy    Never had tcs. No fhcrc   Hemorrhoids    No bleeding    HPI:   Cheryl Bentley is a 50 y.o. female presenting today at the request of Dillard Essex, NP, due to need for screening colonoscopy.   She has history of hemorrhoids but no pain, bleeding. Sometimes will be irritated but no issues in over a year. No abdominal pain, N/V, dysphagia, GERD, changes in bowel habits, constipation, diarrhea. No GI concerns today.   Past Medical History:  Diagnosis Date   Depression    DVT (deep venous thrombosis) (HCC)    Hidradenitis suppurativa    Mastitis    Morbid obesity due to excess calories [facility])     Past Surgical History:  Procedure Laterality Date   AXILLARY HIDRADENITIS EXCISION     INGUINAL HIDRADENITIS EXCISION     multiple surgeries in different areas of the body.     Current Outpatient Medications  Medication Sig Dispense Refill   Adalimumab (HUMIRA Benedict) Inject 40 mg into the skin once a week.      cyclobenzaprine (FLEXERIL) 5 MG tablet Take 1 tablet (5 mg total) by mouth 3 (three) times daily as needed for muscle spasms. Do not drink alcohol or drive while taking this medication.  May cause drowsiness 15 tablet 0   doxycycline (DORYX) 100 MG DR capsule Take 100 mg by mouth daily.     folic acid (FOLVITE) 1 MG tablet Take 1 mg by mouth daily.     furosemide (LASIX) 40 MG tablet Take 40 mg by mouth daily.     ibuprofen (ADVIL,MOTRIN) 200 MG tablet Take 400 mg by mouth every 6 (six) hours as needed for mild pain.     methotrexate 2.5 MG tablet Take 10 mg by mouth once a week. Wednesday     potassium chloride (KLOR-CON) 20 MEQ packet Take by mouth 2 (two) times daily.     spironolactone (ALDACTONE) 50 MG tablet Take 150 mg by mouth daily.      No current  facility-administered medications for this visit.    Allergies as of 05/12/2021 - Review Complete 05/12/2021  Allergen Reaction Noted   Amoxicillin Rash 06/08/2011   Penicillins Rash 06/08/2011    Family History  Problem Relation Age of Onset   Diabetes Mother    Kidney disease Mother    Atrial fibrillation Father    Cancer Father        skin    Diabetes Brother    Cancer Paternal Aunt    Cancer Paternal Uncle     Social History   Socioeconomic History   Marital status: Married    Spouse name: Not on file   Number of children: Not on file   Years of education: Not on file   Highest education level: Not on file  Occupational History   Not on file  Tobacco Use   Smoking status: Never   Smokeless tobacco: Never  Vaping Use   Vaping Use: Never used  Substance and Sexual Activity   Alcohol use: Yes    Comment: rare   Drug use: No   Sexual activity: Yes    Birth control/protection: None  Other Topics Concern   Not on file  Social History Narrative   Not on file   Social Determinants of Health  Financial Resource Strain: Low Risk    Difficulty of Paying Living Expenses: Not very hard  Food Insecurity: No Food Insecurity   Worried About Programme researcher, broadcasting/film/video in the Last Year: Never true   Ran Out of Food in the Last Year: Never true  Transportation Needs: No Transportation Needs   Lack of Transportation (Medical): No   Lack of Transportation (Non-Medical): No  Physical Activity: Insufficiently Active   Days of Exercise per Week: 2 days   Minutes of Exercise per Session: 20 min  Stress: Stress Concern Present   Feeling of Stress : To some extent  Social Connections: Press photographer of Communication with Friends and Family: More than three times a week   Frequency of Social Gatherings with Friends and Family: Twice a week   Attends Religious Services: 1 to 4 times per year   Active Member of [employer] or Organizations: Yes   Attends Museum/gallery exhibitions officer: More than 4 times per year   Marital Status: Married  Catering manager Violence: Not At Risk   Fear of Current or Ex-Partner: No   Emotionally Abused: No   Physically Abused: No   Sexually Abused: No    Review of Systems: Negative as mentioned in HPI  Physical Exam: BP (!) 149/90   Pulse 66   Temp (!) 97.1 F (36.2 C) (Temporal)   Ht 6\' 1"  (1.854 m)   Wt (!) 349 lb 6.4 oz (158.5 kg)   LMP 04/02/2019 (Exact Date)   BMI 46.10 kg/m  General:   Alert and oriented. Pleasant and cooperative. Well-nourished and well-developed.  Head:  Normocephalic and atraumatic. Eyes:  Without icterus, sclera clear and conjunctiva pink.  Ears:  Normal auditory acuity. Mouth:  mask in place Lungs:  Clear to auscultation bilaterally. No wheezes, rales, or rhonchi. No distress.  Heart:  S1, S2 present without murmurs appreciated.  Abdomen:  +BS, soft, non-tender and non-distended. No HSM noted. No masses appreciated.  Rectal:  Deferred  Msk:  Symmetrical without gross deformities. Normal posture. Psych:  Alert and cooperative. Normal mood and affect.  ASSESSMENT: Cheryl Bentley is a 51 y.o. female presenting today at the request of 57, NP/Dr. Dillard Essex for routine screening colonoscopy. No family history of colorectal cancer or polyps.  She has no concerning lower or upper GI signs/symptoms. Due to BMI, will be ASA 3; otherwise, other health issues well managed.      PLAN: Proceed with colonoscopy by Dr. Sudie Bailey  in near future: the risks, benefits, and alternatives have been discussed with the patient in detail. The patient states understanding and desires to proceed.   Further recommendations to follow  Marletta Lor, PhD, ANP-BC [facility]

## 2021-05-12 NOTE — Addendum Note (Signed)
Addended by: Gelene Mink on: 05/12/2021 02:39 PM   Modules accepted: Level of Service

## 2021-05-19 DIAGNOSIS — Z0289 Encounter for other administrative examinations: Secondary | ICD-10-CM

## 2021-05-21 ENCOUNTER — Other Ambulatory Visit: Payer: Self-pay | Admitting: Orthopedic Surgery

## 2021-05-21 DIAGNOSIS — M25561 Pain in right knee: Secondary | ICD-10-CM

## 2021-05-27 ENCOUNTER — Ambulatory Visit
Admission: RE | Admit: 2021-05-27 | Discharge: 2021-05-27 | Disposition: A | Discharge status: Home / Self Care | Payer: BC Managed Care – PPO | Source: Ambulatory Visit | Attending: Orthopedic Surgery | Admitting: Orthopedic Surgery

## 2021-05-27 ENCOUNTER — Other Ambulatory Visit: Payer: Self-pay

## 2021-05-27 DIAGNOSIS — M25561 Pain in right knee: Secondary | ICD-10-CM

## 2021-06-02 ENCOUNTER — Telehealth: Payer: Self-pay | Admitting: Internal Medicine

## 2021-06-02 NOTE — Telephone Encounter (Signed)
Pt said she had an emergency situation with the family and needs to reschedule her procedure with Dr Marletta Lor on 06/08/2021. [phone]

## 2021-06-02 NOTE — Telephone Encounter (Signed)
Called pt, she's unsure at this time when she will be able to do TCS. Needs to cancel for now. Advised her to call back when ready to reschedule. Endo scheduler informed.

## 2021-06-04 ENCOUNTER — Encounter (HOSPITAL_COMMUNITY): Payer: BC Managed Care – PPO

## 2021-06-08 ENCOUNTER — Ambulatory Visit (HOSPITAL_COMMUNITY): Admission: RE | Admit: 2021-06-08 | Payer: BC Managed Care – PPO | Source: Ambulatory Visit

## 2021-06-08 ENCOUNTER — Encounter (HOSPITAL_COMMUNITY): Admission: RE | Payer: Self-pay | Source: Ambulatory Visit

## 2021-06-08 SURGERY — COLONOSCOPY WITH PROPOFOL
Anesthesia: Monitor Anesthesia Care

## 2021-06-22 ENCOUNTER — Encounter (INDEPENDENT_AMBULATORY_CARE_PROVIDER_SITE_OTHER): Payer: Self-pay | Admitting: Bariatrics

## 2021-06-23 ENCOUNTER — Ambulatory Visit (INDEPENDENT_AMBULATORY_CARE_PROVIDER_SITE_OTHER): Payer: BC Managed Care – PPO | Admitting: Bariatrics

## 2021-06-23 NOTE — Telephone Encounter (Signed)
Please address.  Thank you

## 2021-06-30 ENCOUNTER — Encounter: Payer: Self-pay | Admitting: *Deleted

## 2021-06-30 ENCOUNTER — Ambulatory Visit: Payer: BC Managed Care – PPO | Admitting: Diagnostic Neuroimaging

## 2021-06-30 VITALS — BP 155/82 | HR 81 | Ht 73.0 in | Wt 362.0 lb

## 2021-06-30 DIAGNOSIS — R200 Anesthesia of skin: Secondary | ICD-10-CM | POA: Diagnosis not present

## 2021-06-30 DIAGNOSIS — R202 Paresthesia of skin: Secondary | ICD-10-CM | POA: Diagnosis not present

## 2021-06-30 NOTE — Progress Notes (Signed)
[facility]  PATIENT: Cheryl Bentley DOB: December 08, 1970  REFERRING CLINICIAN: Renette Butters, MD HISTORY FROM: patient  REASON FOR VISIT: new consult    HISTORICAL  CHIEF COMPLAINT:  Chief Complaint  Patient presents with   Numbness, Tingling    Rm 6 New Pt "numbness in 2 fingers on right hand x 4 months; NCS neg for carpal tunnel; no hx of injury"    HISTORY OF PRESENT ILLNESS:   50 year old female here for evaluation of right hand/finger numbness.  Symptoms started early 2022.  No specific accidents, injuries, traumas or provoking factors.  Symptoms affecting digits 2 and 3.  Patient had EMG nerve conduction study which was normal.  She has had some neck pain and left shoulder issues.   REVIEW OF SYSTEMS: Full 14 system review of systems performed and negative with exception of: as per HPI.  ALLERGIES: Allergies  Allergen Reactions   Amoxicillin Rash   Penicillins Rash    HOME MEDICATIONS: Outpatient Medications Prior to Visit  Medication Sig Dispense Refill   Adalimumab (HUMIRA [place]) Inject 40 mg into the skin once a week.      doxycycline (DORYX) 100 MG DR capsule Take 100 mg by mouth daily.     folic acid (FOLVITE) 1 MG tablet Take 1 mg by mouth daily.     furosemide (LASIX) 40 MG tablet Take 40 mg by mouth daily.     ibuprofen (ADVIL,MOTRIN) 200 MG tablet Take 400 mg by mouth every 6 (six) hours as needed for mild pain.     methotrexate 2.5 MG tablet Take 10 mg by mouth once a week. Wednesday     potassium chloride (KLOR-CON) 20 MEQ packet Take by mouth 2 (two) times daily.     spironolactone (ALDACTONE) 50 MG tablet Take 150 mg by mouth daily.      cyclobenzaprine (FLEXERIL) 5 MG tablet Take 1 tablet (5 mg total) by mouth 3 (three) times daily as needed for muscle spasms. Do not drink alcohol or drive while taking this medication.  May cause drowsiness (Patient not taking: Reported on 06/30/2021) 15 tablet 0   Na Sulfate-K Sulfate-Mg Sulf  (SUPREP BOWEL PREP KIT) 17.5-3.13-1.6 GM/177ML SOLN Take 1 kit by mouth as directed. 354 mL 0   No facility-administered medications prior to visit.    PAST MEDICAL HISTORY: Past Medical History:  Diagnosis Date   Cervicalgia    Depression    DVT (deep venous thrombosis) (HCC)    Hidradenitis suppurativa    Mastitis    Morbid obesity due to excess calories (HCC)    Numbness and tingling    RUE    PAST SURGICAL HISTORY: Past Surgical History:  Procedure Laterality Date   AXILLARY HIDRADENITIS EXCISION     INGUINAL HIDRADENITIS EXCISION     multiple surgeries in different areas of the body.    left knee arthroscopy      FAMILY HISTORY: Family History  Problem Relation Age of Onset   Diabetes Mother    Kidney disease Mother    Atrial fibrillation Father    Cancer Father        skin    Diabetes Brother    Cancer Paternal Aunt    Cancer Paternal Uncle    Colon cancer Neg Hx    Colon polyps Neg Hx     SOCIAL HISTORY: Social History   Socioeconomic History   Marital status: Married    Spouse name: Saralyn Pilar   Number of children: 0  Years of education: Not on file   Highest education level: Bachelor's degree (e.g., BA, AB, BS)  Occupational History    Comment: office admin  Tobacco Use   Smoking status: Never   Smokeless tobacco: Never  Vaping Use   Vaping Use: Never used  Substance and Sexual Activity   Alcohol use: Yes    Comment: rare   Drug use: No   Sexual activity: Yes    Birth control/protection: None  Other Topics Concern   Not on file  Social History Narrative   Lives with husband   Right handed   Social Determinants of Health   Financial Resource Strain: Low Risk    Difficulty of Paying Living Expenses: Not very hard  Food Insecurity: No Food Insecurity   Worried About Running Out of Food in the Last Year: Never true   Ran Out of Food in the Last Year: Never true  Transportation Needs: No Transportation Needs   Lack of Transportation  (Medical): No   Lack of Transportation (Non-Medical): No  Physical Activity: Insufficiently Active   Days of Exercise per Week: 2 days   Minutes of Exercise per Session: 20 min  Stress: Stress Concern Present   Feeling of Stress : To some extent  Social Connections: Engineer, building services of Communication with Friends and Family: More than three times a week   Frequency of Social Gatherings with Friends and Family: Twice a week   Attends Religious Services: 1 to 4 times per year   Active Member of [employer] or Organizations: Yes   Attends Music therapist: More than 4 times per year   Marital Status: Married  Human resources officer Violence: Not At Risk   Fear of Current or Ex-Partner: No   Emotionally Abused: No   Physically Abused: No   Sexually Abused: No     PHYSICAL EXAM  GENERAL EXAM/CONSTITUTIONAL: Vitals:  Vitals:   06/30/21 1411  BP: (!) 155/82  Pulse: 81  Weight: (!) 362 lb (164.2 kg)  Height: _0  (1.854 m)   Body mass index is 47.76 kg/m. Wt Readings from Last 3 Encounters:  06/30/21 (!) 362 lb (164.2 kg)  05/12/21 (!) 349 lb 6.4 oz (158.5 kg)  02/04/21 (!) 342 lb 6.4 oz (155.3 kg)   Patient is in no distress; well developed, nourished and groomed; neck is supple  CARDIOVASCULAR: Examination of carotid arteries is normal; no carotid bruits Regular rate and rhythm, no murmurs Examination of peripheral vascular system by observation and palpation is normal  EYES: Ophthalmoscopic exam of optic discs and posterior segments is normal; no papilledema or hemorrhages No results found.  MUSCULOSKELETAL: Gait, strength, tone, movements noted in Neurologic exam below  NEUROLOGIC: MENTAL STATUS:  No flowsheet data found. awake, alert, oriented to person, place and time recent and remote memory intact normal attention and concentration language fluent, comprehension intact, naming intact fund of knowledge appropriate  CRANIAL NERVE:  2nd -  no papilledema on fundoscopic exam 2nd, 3rd, 4th, 6th - pupils equal and reactive to light, visual fields full to confrontation, extraocular muscles intact, no nystagmus 5th - facial sensation symmetric 7th - facial strength symmetric 8th - hearing intact 9th - palate elevates symmetrically, uvula midline 11th - shoulder shrug symmetric 12th - tongue protrusion midline  MOTOR:  normal bulk and tone, full strength in the BUE, BLE  SENSORY:  normal and symmetric to light touch, pinprick, temperature, vibration; EXCEPT SLIGHT DECR PP IN RIGHT DIGIT 2 DISTALLY  COORDINATION:  finger-nose-finger, fine finger movements normal  REFLEXES:  deep tendon reflexes present and symmetric  GAIT/STATION:  narrow based gait     DIAGNOSTIC DATA (LABS, IMAGING, TESTING) - I reviewed patient records, labs, notes, testing and imaging myself where available.  Lab Results  Component Value Date   WBC 7.4 05/09/2015   HGB 13.6 05/09/2015   HCT 41.5 05/09/2015   MCV 98.6 05/09/2015   PLT 199 05/09/2015      Component Value Date/Time   NA 136 12/30/2014 1620   K 4.1 12/30/2014 1620   CL 106 12/30/2014 1620   CO2 23 12/30/2014 1620   GLUCOSE 88 12/30/2014 1620   BUN 13 12/30/2014 1620   CREATININE 0.67 12/30/2014 1620   CALCIUM 8.8 (L) 12/30/2014 1620   PROT 8.1 12/30/2014 1620   ALBUMIN 4.1 12/30/2014 1620   AST 18 12/30/2014 1620   ALT 5 (L) 12/30/2014 1620   ALKPHOS 53 12/30/2014 1620   BILITOT 0.7 12/30/2014 1620   GFRNONAA >60 12/30/2014 1620   GFRAA >60 12/30/2014 1620   No results found for: CHOL, HDL, LDLCALC, LDLDIRECT, TRIG, CHOLHDL No results found for: HGBA1C No results found for: VITAMINB12 No results found for: TSH    04/22/21 EMG/NCS  - This is a normal study of the right upper extremity.  In particular, there is no evidence of carpal tunnel syndrome or cervical radiculopathy.     ASSESSMENT AND PLAN  50 y.o. year old female here with:   Dx:  1. Numbness  and tingling in right hand       PLAN:  Right hand numbness (digits 2,3 since Jan 2022; rule out demyelinating disease) - check MRI brain, cervical spine  Orders Placed This Encounter  Procedures   MR BRAIN W WO CONTRAST   MR [place]   Return for pending if symptoms worsen or fail to improve, pending test results.    Penni Bombard, MD 36/43/8377, 9:39 PM Certified in Neurology, Neurophysiology and Neuroimaging  [facility] [address], [place], [place] [address] [phone]

## 2021-06-30 NOTE — Patient Instructions (Signed)
-   check MRI brain, cervical spine 

## 2021-07-07 ENCOUNTER — Ambulatory Visit (INDEPENDENT_AMBULATORY_CARE_PROVIDER_SITE_OTHER): Payer: Self-pay | Admitting: Bariatrics

## 2021-08-04 ENCOUNTER — Ambulatory Visit: Payer: BC Managed Care – PPO | Admitting: Diagnostic Neuroimaging

## 2022-02-08 ENCOUNTER — Other Ambulatory Visit: Payer: Self-pay

## 2022-02-08 DIAGNOSIS — S83241A Other tear of medial meniscus, current injury, right knee, initial encounter: Secondary | ICD-10-CM

## 2022-02-23 ENCOUNTER — Ambulatory Visit
Admission: RE | Admit: 2022-02-23 | Discharge: 2022-02-23 | Disposition: A | Discharge status: Home / Self Care | Payer: BC Managed Care – PPO | Source: Ambulatory Visit | Attending: Orthopedic Surgery | Admitting: Orthopedic Surgery

## 2022-02-23 DIAGNOSIS — S83241A Other tear of medial meniscus, current injury, right knee, initial encounter: Secondary | ICD-10-CM

## 2022-03-24 ENCOUNTER — Encounter (INDEPENDENT_AMBULATORY_CARE_PROVIDER_SITE_OTHER): Payer: Self-pay

## 2022-12-14 ENCOUNTER — Emergency Department (HOSPITAL_COMMUNITY): Payer: BC Managed Care – PPO

## 2022-12-14 ENCOUNTER — Encounter (HOSPITAL_COMMUNITY): Payer: Self-pay | Admitting: *Deleted

## 2022-12-14 ENCOUNTER — Emergency Department (HOSPITAL_COMMUNITY)
Admission: EM | Admit: 2022-12-14 | Discharge: 2022-12-14 | Disposition: A | Discharge status: Home / Self Care | Payer: BC Managed Care – PPO | Attending: Emergency Medicine | Admitting: Emergency Medicine

## 2022-12-14 DIAGNOSIS — S93492A Sprain of other ligament of left ankle, initial encounter: Secondary | ICD-10-CM | POA: Insufficient documentation

## 2022-12-14 DIAGNOSIS — Y92009 Unspecified place in unspecified non-institutional (private) residence as the place of occurrence of the external cause: Secondary | ICD-10-CM | POA: Diagnosis not present

## 2022-12-14 DIAGNOSIS — Y9301 Activity, walking, marching and hiking: Secondary | ICD-10-CM | POA: Diagnosis not present

## 2022-12-14 DIAGNOSIS — Z23 Encounter for immunization: Secondary | ICD-10-CM | POA: Diagnosis not present

## 2022-12-14 DIAGNOSIS — S8001XA Contusion of right knee, initial encounter: Secondary | ICD-10-CM | POA: Diagnosis not present

## 2022-12-14 DIAGNOSIS — S93402A Sprain of unspecified ligament of left ankle, initial encounter: Secondary | ICD-10-CM

## 2022-12-14 DIAGNOSIS — W172XXA Fall into hole, initial encounter: Secondary | ICD-10-CM | POA: Diagnosis not present

## 2022-12-14 DIAGNOSIS — M25572 Pain in left ankle and joints of left foot: Secondary | ICD-10-CM | POA: Diagnosis present

## 2022-12-14 DIAGNOSIS — W19XXXA Unspecified fall, initial encounter: Secondary | ICD-10-CM

## 2022-12-14 MED ORDER — TETANUS-DIPHTH-ACELL PERTUSSIS 5-2.5-18.5 LF-MCG/0.5 IM SUSY
0.5000 mL | PREFILLED_SYRINGE | Freq: Once | INTRAMUSCULAR | Status: AC
  Administered 2022-12-14: 0.5 mL via INTRAMUSCULAR
  Filled 2022-12-14: qty 0.5

## 2022-12-14 MED ORDER — IBUPROFEN 400 MG PO TABS
600.0000 mg | ORAL_TABLET | Freq: Once | ORAL | Status: AC
  Administered 2022-12-14: 600 mg via ORAL
  Filled 2022-12-14: qty 2

## 2022-12-14 NOTE — ED Notes (Signed)
See triage note. Pt alert and oriented. Left ankle swelling noted. Ice applied. Bruising noted to right shin.

## 2022-12-14 NOTE — ED Provider Notes (Signed)
[place] EMERGENCY DEPARTMENT AT [facility] Provider Note   CSN: [id number] Arrival date & time: 12/14/22  4098     History  Chief Complaint  Patient presents with   Ankle Pain    Cheryl Bentley is a 52 y.o. female.  She is brought in by ambulance from home after she tripped while walking outside.  She is not sure if she stepped in a hole or tripped on something.  Complaining of moderate pain left ankle and foot.  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  She said she cannot wiggle her toes even prior to injury.  She also has some abrasions and pain in her right knee and right tib-fib.  She denies any head or neck pain no loss of consciousness no upper extremity pain.  No back pain.  She does not use a cane or a walker at baseline.  The history is provided by the patient and the EMS personnel.  Ankle Pain Location:  Ankle Time since incident:  1 hour Injury: yes   Mechanism of injury: fall   Fall:    Fall occurred:  Walking Ankle location:  L ankle Pain details:    Quality:  Aching   Severity:  Moderate   Onset quality:  Sudden   Timing:  Constant   Progression:  Unchanged Chronicity:  New Tetanus status:  Unknown Associated symptoms: decreased ROM, muscle weakness, numbness and tingling   Associated symptoms: no back pain, no fever and no neck pain   Risk factors: obesity        Home Medications Prior to Admission medications   Medication Sig Start Date End Date Taking? Authorizing Provider  Adalimumab (HUMIRA [place]) Inject 40 mg into the skin once a week.     [provider]  cyclobenzaprine (FLEXERIL) 5 MG tablet Take 1 tablet (5 mg total) by mouth 3 (three) times daily as needed for muscle spasms. Do not drink alcohol or drive while taking this medication.  May cause drowsiness Patient not taking: Reported on 06/30/2021 01/02/21   Particia Nearing, PA-C  doxycycline (DORYX) 100 MG DR capsule Take  100 mg by mouth daily.    [provider]  folic acid (FOLVITE) 1 MG tablet Take 1 mg by mouth daily.    [provider]  furosemide (LASIX) 40 MG tablet Take 40 mg by mouth daily.    [provider]  ibuprofen (ADVIL,MOTRIN) 200 MG tablet Take 400 mg by mouth every 6 (six) hours as needed for mild pain.    [provider]  methotrexate 2.5 MG tablet Take 10 mg by mouth once a week. Wednesday    [provider]  potassium chloride (KLOR-CON) 20 MEQ packet Take by mouth 2 (two) times daily.    [provider]  spironolactone (ALDACTONE) 50 MG tablet Take 150 mg by mouth daily.     [provider]      Allergies    Amoxicillin and Penicillins    Review of Systems   Review of Systems  Constitutional:  Negative for fever.  Respiratory:  Negative for shortness of breath.   Cardiovascular:  Negative for chest pain.  Musculoskeletal:  Negative for back pain and neck pain.  Skin:  Positive for wound.  Neurological:  Positive for weakness and numbness.    Physical Exam Updated Vital Signs BP 128/88   Pulse 78   Temp 98.2 F (36.8 C) (Oral)  Resp 19   LMP 04/02/2019 (Exact Date)   SpO2 100%  Physical Exam Vitals and nursing note reviewed.  Constitutional:      General: She is not in acute distress.    Appearance: Normal appearance. She is well-developed. She is obese.  HENT:     Head: Normocephalic and atraumatic.  Eyes:     Conjunctiva/sclera: Conjunctivae normal.  Cardiovascular:     Rate and Rhythm: Normal rate and regular rhythm.     Heart sounds: No murmur heard. Pulmonary:     Effort: Pulmonary effort is normal. No respiratory distress.     Breath sounds: Normal breath sounds.  Abdominal:     Palpations: Abdomen is soft.     Tenderness: There is no abdominal tenderness.  Musculoskeletal:        General: Tenderness and signs of injury present.     Cervical back: Neck supple.     Comments: Bilateral upper  extremities full range of motion without any pain or limitation.  Neck and back nontender.  Right lower extremity she has some abrasions over her knee and tenderness of her right knee and right tib-fib with some bruising.  Ankle and foot nontender.  Left lower extremity she is nontender at her hip and knee.  She has tenderness of her left lower leg and ankle with moderate swelling over lateral malleolus.  Foot is also diffusely tender.  Distal pulses intact.  She has diminished sensation left foot and not able to move her digits of her left foot.  Skin:    General: Skin is warm and dry.     Capillary Refill: Capillary refill takes less than 2 seconds.  Neurological:     Mental Status: She is alert.     Sensory: Sensory deficit present.     Motor: Weakness present.     ED Results / Procedures / Treatments   Labs (all labs ordered are listed, but only abnormal results are displayed) Labs Reviewed - No data to display  EKG None  Radiology DG Foot Complete Left  Result Date: 12/14/2022 CLINICAL DATA:  fall pain EXAM: LEFT FOOT - COMPLETE 3+ VIEW COMPARISON:  None Available. FINDINGS: No acute fracture or dislocation. Scattered midfoot degenerative changes no area of erosion or osseous destruction. No unexpected radiopaque foreign body. Focal soft tissue edema likely reflecting hematoma of the lateral malleolus. Soft tissue edema anteriorly. IMPRESSION: Focal soft tissue edema overlying the lateral malleolus likely reflecting a hematoma. No definitive underlying acute fracture or dislocation. If persistent clinical concern, recommend dedicated cross-sectional imaging or immobilization with repeat imaging in 2 weeks. Electronically Signed   By: Meda Klinefelter M.D.   On: 12/14/2022 09:58   DG Knee Complete 4 Views Right  Result Date: 12/14/2022 CLINICAL DATA:  fall pain EXAM: RIGHT KNEE - COMPLETE 4+ VIEW; RIGHT TIBIA AND FIBULA - 2 VIEW COMPARISON:  None Available. FINDINGS: No acute fracture  or dislocation. Mild tricompartmental degenerative changes most pronounced in the medial compartment no area of erosion or osseous destruction. No unexpected radiopaque foreign body. Soft tissue edema. IMPRESSION: No acute fracture or dislocation. Electronically Signed   By: Meda Klinefelter M.D.   On: 12/14/2022 09:54   DG Tibia/Fibula Right  Result Date: 12/14/2022 CLINICAL DATA:  fall pain EXAM: RIGHT KNEE - COMPLETE 4+ VIEW; RIGHT TIBIA AND FIBULA - 2 VIEW COMPARISON:  None Available. FINDINGS: No acute fracture or dislocation. Mild tricompartmental degenerative changes most pronounced in the medial compartment no area of erosion or osseous  destruction. No unexpected radiopaque foreign body. Soft tissue edema. IMPRESSION: No acute fracture or dislocation. Electronically Signed   By: Meda Klinefelter M.D.   On: 12/14/2022 09:54   DG Tibia/Fibula Left  Result Date: 12/14/2022 CLINICAL DATA:  fall pain EXAM: LEFT KNEE - COMPLETE 4+ VIEW; LEFT TIBIA AND FIBULA - 2 VIEW COMPARISON:  None Available. FINDINGS: No acute fracture or dislocation. Mild tricompartmental degenerative changes. No area of erosion or osseous destruction. No unexpected radiopaque foreign body. Soft tissues are unremarkable. IMPRESSION: No acute fracture or dislocation. Electronically Signed   By: Meda Klinefelter M.D.   On: 12/14/2022 09:51   DG Knee Complete 4 Views Left  Result Date: 12/14/2022 CLINICAL DATA:  fall pain EXAM: LEFT KNEE - COMPLETE 4+ VIEW; LEFT TIBIA AND FIBULA - 2 VIEW COMPARISON:  None Available. FINDINGS: No acute fracture or dislocation. Mild tricompartmental degenerative changes. No area of erosion or osseous destruction. No unexpected radiopaque foreign body. Soft tissues are unremarkable. IMPRESSION: No acute fracture or dislocation. Electronically Signed   By: Meda Klinefelter M.D.   On: 12/14/2022 09:51   DG Ankle Complete Left  Result Date: 12/14/2022 CLINICAL DATA:  Fall, swelling, injury  EXAM: LEFT ANKLE COMPLETE - 3+ VIEW COMPARISON:  None Available. FINDINGS: Diffuse soft tissue swelling, most pronounced anteriorly and laterally. No acute bony abnormality. Specifically, no fracture, subluxation, or dislocation. Joint spaces maintained. IMPRESSION: Soft tissue swelling most pronounced anteriorly and laterally. No acute bony abnormality. Electronically Signed   By: Charlett Nose M.D.   On: 12/14/2022 09:40    Procedures Procedures    Medications Ordered in ED Medications  Tdap (BOOSTRIX) injection 0.5 mL (has no administration in time range)    ED Course/ Medical Decision Making/ A&P                             Medical Decision Making Amount and/or Complexity of Data Reviewed Radiology: ordered.  Risk Prescription drug management.   This patient complains of left ankle pain status post fall; this involves an extensive number of treatment Options and is a complaint that carries with it a high risk of complications and morbidity. The differential includes fracture, dislocation, contusion, sprain I ordered medication oral Motrin and reviewed PMP when indicated. I ordered imaging studies which included x-rays of right and left lower extremities and I independently    visualized and interpreted imaging which showed soft tissue swelling no acute fracture Additional history obtained from EMS and patient's significant other Previous records obtained and reviewed in epic no recent admissions Social determinants considered, no significant barriers Critical Interventions: None  After the interventions stated above, I reevaluated the patient and found patient to be neurologically at baseline and in no distress Admission and further testing considered, no indications for admission at this time.  Placed in cam boot.  Offered crutches patient declines.  She already has seen Delbert Harness and orthopedics before so given contact information for them.  Return instructions  discussed         Final Clinical Impression(s) / ED Diagnoses Final diagnoses:  Sprain of left ankle, unspecified ligament, initial encounter  Contusion of right knee, initial encounter  Fall, initial encounter    Rx / DC Orders ED Discharge Orders     None         Terrilee Files, MD 12/14/22 1902

## 2022-12-14 NOTE — Discharge Instructions (Signed)
You were seen in the emergency department for evaluation of injuries from a fall.  You had x-rays of your left lower leg ankle foot right knee and right lower leg that did not show any obvious fracture.  There was significant soft tissue swelling.  Please use the cam boot on your left leg and weightbearing as tolerated.  Ice and elevation.  Ibuprofen 3 times a day with food.  Follow-up with your orthopedic doctor.  Return to the emergency department if any worsening or concerning symptoms.  Soap and water for your abrasions on your knee.

## 2022-12-14 NOTE — ED Triage Notes (Signed)
Pt in via from home via [facility] EMS, per report the pt fell in a hole at home and hurt her L ankle, swelling noted, no obvious deformity, denies hitting head, does not take thinners, +DP present, A&O x4, ankle immobolized upon arrival with a pillow, R knee abrasion present, no bleeding noted

## 2023-06-06 ENCOUNTER — Other Ambulatory Visit: Payer: Self-pay | Admitting: Family Medicine

## 2023-06-06 DIAGNOSIS — Z1231 Encounter for screening mammogram for malignant neoplasm of breast: Secondary | ICD-10-CM

## 2023-07-21 ENCOUNTER — Ambulatory Visit
Admission: RE | Admit: 2023-07-21 | Discharge: 2023-07-21 | Disposition: A | Discharge status: Home / Self Care | Payer: BC Managed Care – PPO | Source: Ambulatory Visit | Attending: Family Medicine | Admitting: Family Medicine

## 2023-07-21 DIAGNOSIS — Z1231 Encounter for screening mammogram for malignant neoplasm of breast: Secondary | ICD-10-CM
